# Patient Record
Sex: Female | Born: 1935 | Race: White | Hispanic: No | Smoking: Former smoker
Health system: Southern US, Community
[De-identification: ages and names within clinical notes are randomized; demographics above are authoritative.]

## PROBLEM LIST (undated history)

## (undated) ENCOUNTER — Encounter: Attending: Gynecologic Oncology | Primary: Gynecologic Oncology

## (undated) ENCOUNTER — Encounter

## (undated) ENCOUNTER — Telehealth

## (undated) ENCOUNTER — Inpatient Hospital Stay

## (undated) ENCOUNTER — Telehealth: Attending: Family | Primary: Family

## (undated) ENCOUNTER — Telehealth: Attending: Vascular Surgery | Primary: Vascular Surgery

## (undated) ENCOUNTER — Ambulatory Visit: Payer: MEDICARE

## (undated) ENCOUNTER — Ambulatory Visit

## (undated) ENCOUNTER — Ambulatory Visit: Payer: MEDICARE | Attending: Gynecologic Oncology | Primary: Gynecologic Oncology

## (undated) ENCOUNTER — Encounter: Attending: Vascular Surgery | Primary: Vascular Surgery

## (undated) ENCOUNTER — Telehealth: Attending: Gynecologic Oncology | Primary: Gynecologic Oncology

## (undated) ENCOUNTER — Encounter: Attending: Family | Primary: Family

## (undated) ENCOUNTER — Telehealth: Attending: Obstetrics & Gynecology | Primary: Obstetrics & Gynecology

## (undated) ENCOUNTER — Telehealth
Attending: Student in an Organized Health Care Education/Training Program | Primary: Student in an Organized Health Care Education/Training Program

## (undated) DIAGNOSIS — E119 Type 2 diabetes mellitus without complications: Secondary | ICD-10-CM

## (undated) DIAGNOSIS — Z9289 Personal history of other medical treatment: Secondary | ICD-10-CM

## (undated) DIAGNOSIS — I10 Essential (primary) hypertension: Secondary | ICD-10-CM

## (undated) DIAGNOSIS — E785 Hyperlipidemia, unspecified: Secondary | ICD-10-CM

## (undated) DIAGNOSIS — I509 Heart failure, unspecified: Secondary | ICD-10-CM

## (undated) DIAGNOSIS — H269 Unspecified cataract: Secondary | ICD-10-CM

## (undated) DIAGNOSIS — E079 Disorder of thyroid, unspecified: Secondary | ICD-10-CM

## (undated) DIAGNOSIS — I2510 Atherosclerotic heart disease of native coronary artery without angina pectoris: Secondary | ICD-10-CM

## (undated) DIAGNOSIS — I712 Thoracic aortic aneurysm, without rupture: Secondary | ICD-10-CM

## (undated) HISTORY — DX: Personal history of other medical treatment: Z92.89

## (undated) HISTORY — PX: ABDOMINAL AORTA STENT: SHX1108

## (undated) HISTORY — PX: SHOULDER ARTHROSCOPY W/ ROTATOR CUFF REPAIR: SHX2400

## (undated) HISTORY — DX: Type 2 diabetes mellitus without complications: E11.9

## (undated) HISTORY — PX: BACK SURGERY: SHX140

## (undated) HISTORY — DX: Atherosclerotic heart disease of native coronary artery without angina pectoris: I25.10

## (undated) HISTORY — DX: Disorder of thyroid, unspecified: E07.9

## (undated) HISTORY — PX: EYE SURGERY: SHX253

## (undated) HISTORY — PX: ABDOMINAL HYSTERECTOMY: SHX81

## (undated) HISTORY — DX: Essential (primary) hypertension: I10

## (undated) HISTORY — DX: Hyperlipidemia, unspecified: E78.5

## (undated) HISTORY — PX: FRACTURE SURGERY: SHX138

## (undated) HISTORY — DX: Heart failure, unspecified: I50.9

## (undated) HISTORY — PX: CHOLECYSTECTOMY: SHX55

## (undated) HISTORY — PX: SPINE SURGERY: SHX786

## (undated) HISTORY — DX: Unspecified cataract: H26.9

## (undated) HISTORY — PX: APPENDECTOMY: SHX54

## (undated) HISTORY — DX: Thoracic aortic aneurysm, without rupture: I71.2

---
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
## 1994-11-10 HISTORY — PX: US ECHOCARDIOGRAPHY: HXRAD669

## 1995-04-11 HISTORY — PX: CARDIAC CATHETERIZATION: SHX172

## 2002-01-13 ENCOUNTER — Encounter: Payer: Self-pay | Admitting: Cardiology

## 2002-01-13 ENCOUNTER — Encounter: Admission: RE | Admit: 2002-01-13 | Discharge: 2002-01-13 | Payer: Self-pay | Admitting: Cardiology

## 2003-01-10 ENCOUNTER — Encounter: Admission: RE | Admit: 2003-01-10 | Discharge: 2003-01-10 | Payer: Self-pay | Admitting: Cardiology

## 2003-01-10 ENCOUNTER — Encounter: Payer: Self-pay | Admitting: Cardiology

## 2003-03-22 ENCOUNTER — Observation Stay (HOSPITAL_COMMUNITY): Admission: EM | Admit: 2003-03-22 | Discharge: 2003-03-23 | Payer: Self-pay | Admitting: Cardiovascular Disease

## 2003-03-23 ENCOUNTER — Encounter: Payer: Self-pay | Admitting: *Deleted

## 2004-04-12 ENCOUNTER — Encounter: Admission: RE | Admit: 2004-04-12 | Discharge: 2004-04-12 | Payer: Self-pay | Admitting: Cardiology

## 2004-04-17 ENCOUNTER — Encounter: Admission: RE | Admit: 2004-04-17 | Discharge: 2004-04-17 | Payer: Self-pay | Admitting: Cardiology

## 2005-02-26 ENCOUNTER — Encounter: Admission: RE | Admit: 2005-02-26 | Discharge: 2005-02-26 | Payer: Self-pay | Admitting: Cardiology

## 2006-01-12 ENCOUNTER — Encounter: Admission: RE | Admit: 2006-01-12 | Discharge: 2006-01-12 | Payer: Self-pay | Admitting: Cardiology

## 2007-01-07 ENCOUNTER — Encounter: Admission: RE | Admit: 2007-01-07 | Discharge: 2007-01-07 | Payer: Self-pay | Admitting: Cardiology

## 2007-12-27 ENCOUNTER — Encounter: Admission: RE | Admit: 2007-12-27 | Discharge: 2007-12-27 | Payer: Self-pay | Admitting: Cardiology

## 2008-03-15 DIAGNOSIS — Z9289 Personal history of other medical treatment: Secondary | ICD-10-CM

## 2008-03-15 HISTORY — DX: Personal history of other medical treatment: Z92.89

## 2009-01-02 ENCOUNTER — Encounter: Admission: RE | Admit: 2009-01-02 | Discharge: 2009-01-02 | Payer: Self-pay | Admitting: Cardiology

## 2010-02-14 ENCOUNTER — Encounter: Admission: RE | Admit: 2010-02-14 | Discharge: 2010-02-14 | Payer: Self-pay | Admitting: Cardiology

## 2011-01-28 ENCOUNTER — Other Ambulatory Visit: Payer: Self-pay | Admitting: Cardiology

## 2011-01-28 DIAGNOSIS — I712 Thoracic aortic aneurysm, without rupture: Secondary | ICD-10-CM

## 2011-02-18 ENCOUNTER — Ambulatory Visit
Admission: RE | Admit: 2011-02-18 | Discharge: 2011-02-18 | Disposition: A | Discharge status: Home / Self Care | Payer: Medicare Other | Source: Ambulatory Visit | Attending: Cardiology | Admitting: Cardiology

## 2011-02-18 DIAGNOSIS — I712 Thoracic aortic aneurysm, without rupture: Secondary | ICD-10-CM

## 2011-02-18 MED ORDER — IOHEXOL 350 MG/ML SOLN
100.0000 mL | Freq: Once | INTRAVENOUS | Status: AC | PRN
  Administered 2011-02-18: 100 mL via INTRAVENOUS

## 2011-03-28 NOTE — Discharge Summary (Signed)
   NAME:  Alicia Carroll, Alicia Carroll                          ACCOUNT NO.:  [id number]   MEDICAL RECORD NO.:  [id number]                   PATIENT TYPE:  INP   LOCATION:  2030                                 FACILITY:  [facility]   PHYSICIAN:  Nanetta Batty, M.D.                DATE OF BIRTH:  04-May-1929   DATE OF ADMISSION:  03/22/2003  DATE OF DISCHARGE:  03/23/2003                                 DISCHARGE SUMMARY   DISCHARGE DIAGNOSES:  1. Chest pain, myocardial infarction ruled out.  2. Peripheral vascular disease with known abdominal aortic aneurysm, 4.6 cm     by CT this admission.  3. Cholelithiasis.  4. Treated hypertension.   HOSPITAL COURSE:  The patient is a 77 year old female followed by Dr. Clarene Duke  and Dr. Merton Border in [place].  She was admitted on 03/22/03 with chest pain.  MI was ruled out by enzymes.  Followup CT was done and the preliminary  showed a 4.6 cm ascending aortic aneurysm without pulmonary embolism.  She  did have a fatty liver with question small hypodense lesions.  We felt she  could be discharged and will follow up with Dr. Clarene Duke.   DISCHARGE MEDICATIONS:  1. Enalapril 20 mg daily.  2. Lipitor 10 mg daily.  3. Corgard 20 mg daily.  4. Lanoxin 0.25 mg daily.  5. Aspirin 325 mg daily.  6. Lasix 40 mg b.i.d.  7. Potassium 20 mEq b.i.d.  8. Norvasc 10 mg daily.  9. Imdur 30 mg daily.  10.      Protonix 40 mg daily.  11.      Nitroglycerin sublingual p.r.n.   LABORATORY DATA:  WBC 9.9, hemoglobin 12.6, hematocrit 37.2, platelets 217.  Sodium 140, potassium 3.7, BUN 12, creatinine 0.6, INR 1.0, CK-MB and  troponin's are negative x3.  Digoxin level is 0.4.  TSH 2.029.  Lipid  profile is pending.  CT preliminary is as noted above.    DISPOSITION:  The patient is discharged in stable condition.  She will have  a Cardiolite Monday at 11:15.  We will contact Dr. Fredirick Maudlin office about  further followup.     Abelino Derrick, P.A.                      Nanetta Batty,  M.D.    Lenard Lance  D:  03/23/2003  T:  03/24/2003  Job:  [id number]   cc:   Nanetta Batty, M.D.  [address]  [place]  [place] [address]  Fax: [phone]   Merton Border, M.D.  [place]  [place]

## 2012-02-04 ENCOUNTER — Other Ambulatory Visit: Payer: Self-pay | Admitting: Cardiology

## 2012-02-04 DIAGNOSIS — IMO0001 Reserved for inherently not codable concepts without codable children: Secondary | ICD-10-CM

## 2012-02-05 ENCOUNTER — Other Ambulatory Visit: Payer: Self-pay | Admitting: Cardiology

## 2012-02-09 ENCOUNTER — Ambulatory Visit
Admission: RE | Admit: 2012-02-09 | Discharge: 2012-02-09 | Disposition: A | Discharge status: Home / Self Care | Payer: Medicare Other | Source: Ambulatory Visit | Attending: Cardiology | Admitting: Cardiology

## 2012-02-09 DIAGNOSIS — IMO0001 Reserved for inherently not codable concepts without codable children: Secondary | ICD-10-CM

## 2012-02-09 MED ORDER — IOHEXOL 350 MG/ML SOLN
100.0000 mL | Freq: Once | INTRAVENOUS | Status: AC | PRN
  Administered 2012-02-09: 100 mL via INTRAVENOUS

## 2013-02-22 ENCOUNTER — Other Ambulatory Visit: Payer: Self-pay | Admitting: Internal Medicine

## 2013-02-22 DIAGNOSIS — I714 Abdominal aortic aneurysm, without rupture, unspecified: Secondary | ICD-10-CM

## 2013-03-02 ENCOUNTER — Ambulatory Visit (HOSPITAL_COMMUNITY)
Admission: RE | Admit: 2013-03-02 | Discharge: 2013-03-02 | Disposition: A | Discharge status: Home / Self Care | Payer: Medicare Other | Source: Ambulatory Visit | Attending: Internal Medicine | Admitting: Internal Medicine

## 2013-03-02 ENCOUNTER — Other Ambulatory Visit: Payer: Self-pay | Admitting: Internal Medicine

## 2013-03-02 ENCOUNTER — Ambulatory Visit (HOSPITAL_COMMUNITY): Admission: RE | Admit: 2013-03-02 | Payer: Medicare Other | Source: Ambulatory Visit

## 2013-03-02 DIAGNOSIS — I714 Abdominal aortic aneurysm, without rupture: Secondary | ICD-10-CM

## 2013-03-02 DIAGNOSIS — K5730 Diverticulosis of large intestine without perforation or abscess without bleeding: Secondary | ICD-10-CM | POA: Insufficient documentation

## 2013-03-02 DIAGNOSIS — I712 Thoracic aortic aneurysm, without rupture, unspecified: Secondary | ICD-10-CM | POA: Insufficient documentation

## 2013-03-02 DIAGNOSIS — R599 Enlarged lymph nodes, unspecified: Secondary | ICD-10-CM | POA: Insufficient documentation

## 2013-03-02 DIAGNOSIS — Z9089 Acquired absence of other organs: Secondary | ICD-10-CM | POA: Insufficient documentation

## 2013-03-02 DIAGNOSIS — I2510 Atherosclerotic heart disease of native coronary artery without angina pectoris: Secondary | ICD-10-CM | POA: Insufficient documentation

## 2013-03-02 DIAGNOSIS — I77811 Abdominal aortic ectasia: Secondary | ICD-10-CM | POA: Insufficient documentation

## 2013-03-02 MED ORDER — IOHEXOL 350 MG/ML SOLN
100.0000 mL | Freq: Once | INTRAVENOUS | Status: AC | PRN
  Administered 2013-03-02: 100 mL via INTRAVENOUS

## 2013-03-21 ENCOUNTER — Encounter: Payer: Self-pay | Admitting: *Deleted

## 2013-03-21 DIAGNOSIS — I10 Essential (primary) hypertension: Secondary | ICD-10-CM | POA: Insufficient documentation

## 2013-03-21 DIAGNOSIS — E785 Hyperlipidemia, unspecified: Secondary | ICD-10-CM | POA: Insufficient documentation

## 2013-03-21 DIAGNOSIS — E119 Type 2 diabetes mellitus without complications: Secondary | ICD-10-CM

## 2013-03-21 DIAGNOSIS — I712 Thoracic aortic aneurysm, without rupture, unspecified: Secondary | ICD-10-CM

## 2013-03-21 HISTORY — DX: Type 2 diabetes mellitus without complications: E11.9

## 2013-03-21 HISTORY — DX: Thoracic aortic aneurysm, without rupture, unspecified: I71.20

## 2013-03-21 HISTORY — DX: Thoracic aortic aneurysm, without rupture: I71.2

## 2013-03-22 ENCOUNTER — Encounter: Payer: Self-pay | Admitting: Thoracic Surgery (Cardiothoracic Vascular Surgery)

## 2013-03-22 ENCOUNTER — Institutional Professional Consult (permissible substitution) (INDEPENDENT_AMBULATORY_CARE_PROVIDER_SITE_OTHER): Payer: Medicare Other | Admitting: Thoracic Surgery (Cardiothoracic Vascular Surgery)

## 2013-03-22 VITALS — BP 117/68 | HR 72 | Resp 20 | Ht 66.0 in | Wt 143.0 lb

## 2013-03-22 DIAGNOSIS — I712 Thoracic aortic aneurysm, without rupture: Secondary | ICD-10-CM | POA: Insufficient documentation

## 2013-03-22 DIAGNOSIS — I716 Thoracoabdominal aortic aneurysm, without rupture: Secondary | ICD-10-CM

## 2013-03-22 DIAGNOSIS — Z8679 Personal history of other diseases of the circulatory system: Secondary | ICD-10-CM | POA: Insufficient documentation

## 2013-03-22 NOTE — Progress Notes (Signed)
PCP is Nino Glow, MD Referring Provider is Chrystie Nose., MD  Chief Complaint  Patient presents with  . Thoracic Aortic Aneurysm    Surgical eval, CTA Chest 03/02/13     HPI: 79 year old woman sent for evaluation of the ascending and descending thoracic aortic aneurysms.  Mrs. Jenning is an 80 year old woman who has a long history of known thoracoabdominal aneurysm. She's been followed for that for about 17 years. She was followed by Dr. Julieanne Manson for a long time. He retired in her care was shifted to Dr. Rennis Golden.  She has a past medical history significant for congestive heart failure 18 years ago, hypertension, hypercholesterolemia, hypothyroidism, and an aortic aneurysm. She has been in good health recently and denies any recent chest pain or pressure, abdominal or back pain, shortness of breath, syncopal or presyncopal spells. Denies any history of coronary disease.  She says that about a year ago she had a episode of abdominal discomfort for which she went to the hospital in [facility]. A CT of the abdomen was done and she was transferred to [facility] for emergency surgery. When she arrived there the vascular surgeons told her that she did not need surgery. She had an annual followup scan in late April.  She remains active and rides a stationary bike the equivalent of 10 miles a day.   Past Medical History  Diagnosis Date  . Hypertension   . Hyperlipidemia   . Thoracic aortic aneurysm without mention of rupture 03/21/13    ASCENDING AND DESCENDING, ECTASIA OF AORTIC ARCH  . Diabetes mellitus without complication 03/21/13    "BORDERLINE"  . Cataracts, bilateral   . Congestive heart failure   . Thyroid disease     Past Surgical History  Procedure Laterality Date  . Abdominal hysterectomy    . Cholecystectomy    . Appendectomy    . Cardiac catheterization    . Eye surgery    . Fracture surgery    . Back surgery      Family History  Problem Relation Age of  Onset  . Cancer Father   . Stroke Mother   . COPD Brother     Social History History  Substance Use Topics  . Smoking status: Former Smoker    Types: Cigarettes    Quit date: 03/22/1983  . Smokeless tobacco: Not on file  . Alcohol Use: Not on file    Current Outpatient Prescriptions  Medication Sig Dispense Refill  . amLODipine (NORVASC) 10 MG tablet Take 10 mg by mouth daily.      Marland Kitchen aspirin 81 MG tablet Take 81 mg by mouth daily.      . brimonidine (ALPHAGAN P) 0.1 % SOLN Place 2 drops into both eyes 2 (two) times daily.      . Calcium Carb-Cholecalciferol (CALCIUM + D3) 600-200 MG-UNIT TABS Take 1 tablet by mouth daily.      Marland Kitchen dexlansoprazole (DEXILANT) 60 MG capsule Take 60 mg by mouth daily.      . digoxin (LANOXIN) 0.25 MG tablet Take 0.25 mg by mouth daily.      . dorzolamide-timolol (COSOPT) 22.3-6.8 MG/ML ophthalmic solution Place 1 drop into both eyes 2 (two) times daily.      . fish oil-omega-3 fatty acids 1000 MG capsule Take 2 g by mouth daily.      . furosemide (LASIX) 40 MG tablet Take 40 mg by mouth daily.      Marland Kitchen levothyroxine (SYNTHROID, LEVOTHROID) 25 MCG tablet Take 25  mcg by mouth daily before breakfast.      . lisinopril (PRINIVIL,ZESTRIL) 40 MG tablet Take 40 mg by mouth daily.      . Multiple Vitamin (MULTIVITAMIN) tablet Take 1 tablet by mouth daily.      . nadolol (CORGARD) 20 MG tablet Take 20 mg by mouth daily.      Marland Kitchen perphenazine-amitriptyline (ETRAFON/TRIAVIL) 4-25 MG TABS Take 1 tablet by mouth daily.      . Potassium Chloride (KCL-20 PO) Take 1 tablet by mouth daily.      . simvastatin (ZOCOR) 40 MG tablet Take 40 mg by mouth every evening.      . Travoprost, BAK Free, (TRAVATAN) 0.004 % SOLN ophthalmic solution Place 2 drops into both eyes at bedtime.       No current facility-administered medications for this visit.    Allergies  Allergen Reactions  . Codeine Nausea And Vomiting    Review of Systems  HENT:       Wears glasses   Cardiovascular: Positive for leg swelling. Negative for chest pain.  Gastrointestinal: Negative for abdominal pain.       Hiatal hernia  Genitourinary: Negative for flank pain.  Musculoskeletal: Negative for back pain.  Neurological: Negative for dizziness and syncope.  Hematological: Bruises/bleeds easily.  All other systems reviewed and are negative.    BP 117/68  Pulse 72  Resp 20  Ht 5\' 6"  (1.676 m)  Wt 143 lb (64.864 kg)  BMI 23.09 kg/m2  SpO2 96% Physical Exam  Vitals reviewed. Constitutional: She is oriented to person, place, and time. She appears well-developed and well-nourished. No distress.  HENT:  Head: Normocephalic and atraumatic.  Eyes: EOM are normal. Pupils are equal, round, and reactive to light.  Neck: Neck supple. No thyromegaly present.  Cardiovascular: Normal rate and regular rhythm.   Murmur (2-3/6 harsh systolic murmur) heard. Pulmonary/Chest: Effort normal and breath sounds normal. She has no wheezes. She has no rales.  Abdominal: Soft. She exhibits mass (Pulsatile mass palpable in the upper abdomen, nontender). There is no tenderness.  Musculoskeletal: She exhibits no edema.  Lymphadenopathy:    She has no cervical adenopathy.  Neurological: She is alert and oriented to person, place, and time. No cranial nerve deficit.  Skin: Skin is warm and dry.     Diagnostic Tests: CT angiogram of chest 03/02/13 *RADIOLOGY REPORT*  Clinical Data: Follow-up evaluation for known thoracic aortic  aneurysm.  CT ANGIOGRAPHY CHEST, ABDOMEN AND PELVIS, 3-DIMENSIONAL CT IMAGE  RENDERING ON INDEPENDENT WORKSTATION  Technique: Multidetector CT imaging through the chest, abdomen and  pelvis was performed using the standard protocol during bolus  administration of intravenous contrast. Multiplanar reconstructed  images including MIPs were obtained and reviewed to evaluate the  vascular anatomy. Post processing was performed on an independent  workstation and to  create three-dimensional reconstructions.  Contrast: OMNIPAQUE IOHEXOL 350 MG/ML SOLN  Comparison: CTA of the chest abdomen and pelvis 02/09/2012.  CTA CHEST  Findings:  Mediastinum: Mild aneurysmal dilatation of the ascending thoracic  aorta (4.6 x 4.8 cm) is noted. The thoracic aortic arch is ectatic  measuring up to 3.5 cm in diameter. The descending thoracic aorta  is remarkable for fusiform aneurysmal dilatation which is most  pronounced shortly above the aortic hiatus, where the aorta  measures up to 5.2 x 5.6 cm (outer diameter) when measured  orthogonal to the direction of blood flow on multiplanar  reconstructions. This appears only slightly increased compared to  the prior  study from 02/09/2012, however, along the anteromedial  wall of the aorta as it transitions through the aortic hiatus there  is a new small filling defect which appears to represent some  developing mural thrombus. No evidence of thoracic aortic  dissection at this time. Heart size is mildly enlarged. There is no  significant pericardial fluid, thickening or pericardial  calcification. There is atherosclerosis of the thoracic aorta, the  great vessels of the mediastinum and the coronary arteries,  including calcified atherosclerotic plaque in the left anterior  descending and right coronary arteries. Mild calcification of the  aortic valve. Thickening of the esophagus, most severe in the  distal third, concerning for potential esophagitis. No  pathologically enlarged mediastinal or hilar lymph nodes.  Heterogeneous appearing thyroid gland, similar to prior studies.  Lungs/Pleura: No acute consolidative airspace disease. No pleural  effusions. No definite suspicious appearing pulmonary nodules or  masses are identified.  Musculoskeletal: There are no aggressive appearing lytic or blastic  lesions noted in the visualized portions of the skeleton.  Review of the MIP images confirms the above findings.   IMPRESSION:  1. Fusiform aneurysmal dilatation of the descending thoracic aorta  measuring up to 5.2 x 5.6 cm shortly above the level of the aortic  hiatus. This is very similar to the prior study from 02/09/2012.  The notable difference, is a small filling defect along the  anteromedial wall of the descending thoracic aorta which appears to  represent some developing mural thrombus. No thoracic aortic  dissection at this time.  2. No significant change in mild aneurysmal dilatation of the  ascending thoracic aorta (4.6 x 4.8 cm), or mild ectasia of the  aortic arch (3.5 cm in diameter) compared to the prior examination.  3. Extensive atherosclerosis, including two-vessel coronary artery  disease.  4. Thickening of the esophagus, most severe in the distal third.  This is similar to the prior examination, and may be a  manifestation of esophagitis.  5. Additional incidental findings, as above.   Impression: 79 year old woman with 2 separate aortic aneurysms. She has a 4.6 cm ascending aortic aneurysm. Of more immediate concern she also has a larger thoracoabdominal aneurysm. It was measured at 5.2 x 5.6 cm by radiology. I can really only find an area where it is in the 5.2 range. This is not significantly changed from her scan last year. In going back as far as 2011 there is not a dramatic change in the size of the aneurysm although I do think it has grown slightly in that time frame.   In my opinion this thoracoabdominal aneurysm is large enough that consideration should be given to elective surgical repair. In reviewing the films I think that there is a chance this could be done with a stent graft, but does approach the visceral vessels inferiorly. I would like to have Dr. Durene Cal of [facility] see her and look at the films and see if he thinks it should be a candidate for a stent graft repair.  The ascending aortic aneurysm also may have grown very slightly over the 3 year interval from  2011 to now, but is not large enough at this point to warrant surgical intervention. It will need to be followed up with a CT angiogram one year.  Plan: Consult Dr. Myra Gianotti for possible stent graft with thoracoabdominal aneurysm  Followup in one year with CT angiogram chest

## 2013-05-06 ENCOUNTER — Encounter: Payer: Self-pay | Admitting: Surgery

## 2013-05-09 ENCOUNTER — Encounter: Payer: Self-pay | Admitting: Surgery

## 2013-05-09 ENCOUNTER — Ambulatory Visit (INDEPENDENT_AMBULATORY_CARE_PROVIDER_SITE_OTHER): Payer: Medicare Other | Admitting: Surgery

## 2013-05-09 VITALS — BP 137/74 | HR 66 | Resp 16 | Ht 63.0 in | Wt 144.0 lb

## 2013-05-09 DIAGNOSIS — I712 Thoracic aortic aneurysm, without rupture, unspecified: Secondary | ICD-10-CM | POA: Insufficient documentation

## 2013-05-09 NOTE — Progress Notes (Signed)
[facility]   Patient name: Alicia Carroll MRN: [id number] DOB: [birth year] Sex: female   Referred by: Dr. Dorris Fetch  Reason for referral:  Chief Complaint  Patient presents with  . New Evaluation    5 cm TAA for possible stent graft, Ref. by Dr. Dorris Fetch    HISTORY OF PRESENT ILLNESS: The patient comes in today for evaluation of a known thoracoabdominal aneurysm. This has been followed for approximately 17 years. She recently had a CT scan which showed that it was greater than 5 cm. She denies any abdominal or back pain.  The patient has a history of heart failure 18 years ago. She is medically managed for hypertension. She is on a statin for hypercholesterolemia. She has been diagnosed with borderline diabetes, although her blood sugars every morning that she checks are around 95-100. She does not have a history of coronary artery disease. She is very active and ride the stationary bike approximately 10 miles per day. She has a history of smoking but quit in 1984.  Past Medical History  Diagnosis Date  . Hypertension   . Hyperlipidemia   . Thoracic aortic aneurysm without mention of rupture 03/21/13    ASCENDING AND DESCENDING, ECTASIA OF AORTIC ARCH  . Diabetes mellitus without complication 03/21/13    "BORDERLINE"  . Cataracts, bilateral   . Congestive heart failure   . Thyroid disease   . Coronary artery disease     Past Surgical History  Procedure Laterality Date  . Abdominal hysterectomy    . Cholecystectomy    . Appendectomy    . Cardiac catheterization    . Fracture surgery    . Back surgery    . Eye surgery      Bilateral cataract  . Spine surgery    . Shoulder arthroscopy w/ rotator cuff repair Right     History   Social History  . Marital Status: Widowed    Spouse Name: N/A    Number of Children: 4  . Years of Education: N/A   Occupational History  . retired    Social History Main Topics  . Smoking status: Former  Smoker    Types: Cigarettes    Quit date: 03/22/1983  . Smokeless tobacco: Never Used  . Alcohol Use: No  . Drug Use: No  . Sexually Active: Not on file   Other Topics Concern  . Not on file   Social History Narrative  . No narrative on file    Family History  Problem Relation Age of Onset  . Cancer Father   . Stroke Mother   . COPD Brother   . Cancer Sister   . Heart disease Sister   . Hypertension Sister   . COPD Brother     Allergies as of 05/09/2013 - Review Complete 05/09/2013  Allergen Reaction Noted  . Codeine Nausea And Vomiting 03/21/2013    Current Outpatient Prescriptions on File Prior to Visit  Medication Sig Dispense Refill  . amLODipine (NORVASC) 10 MG tablet Take 10 mg by mouth daily.      Marland Kitchen aspirin 81 MG tablet Take 81 mg by mouth daily.      . brimonidine (ALPHAGAN P) 0.1 % SOLN Place 2 drops into both eyes 2 (two) times daily.      . Calcium Carb-Cholecalciferol (CALCIUM + D3) 600-200 MG-UNIT TABS Take 1 tablet by mouth daily.      Marland Kitchen dexlansoprazole (DEXILANT) 60 MG capsule Take 60 mg by mouth  daily.      . digoxin (LANOXIN) 0.25 MG tablet Take 0.25 mg by mouth daily.      . dorzolamide-timolol (COSOPT) 22.3-6.8 MG/ML ophthalmic solution Place 1 drop into both eyes 2 (two) times daily.      . fish oil-omega-3 fatty acids 1000 MG capsule Take 2 g by mouth daily.      . furosemide (LASIX) 40 MG tablet Take 40 mg by mouth daily.      Marland Kitchen levothyroxine (SYNTHROID, LEVOTHROID) 25 MCG tablet Take 25 mcg by mouth daily before breakfast.      . lisinopril (PRINIVIL,ZESTRIL) 40 MG tablet Take 40 mg by mouth daily.      . Multiple Vitamin (MULTIVITAMIN) tablet Take 1 tablet by mouth daily. Centrum Silver      . nadolol (CORGARD) 20 MG tablet Take 20 mg by mouth daily.      Marland Kitchen perphenazine-amitriptyline (ETRAFON/TRIAVIL) 4-25 MG TABS Take 1 tablet by mouth daily.      . Potassium Chloride (KCL-20 PO) Take 1 tablet by mouth daily.      . simvastatin (ZOCOR) 40 MG  tablet Take 40 mg by mouth every evening.      . Travoprost, BAK Free, (TRAVATAN) 0.004 % SOLN ophthalmic solution Place 2 drops into both eyes at bedtime.       No current facility-administered medications on file prior to visit.     REVIEW OF SYSTEMS: Cardiovascular: No chest pain, chest pressure, palpitations, orthopnea, or dyspnea on exertion. No claudication or rest pain,  No history of DVT or phlebitis. Pulmonary: No productive cough, asthma or wheezing. Neurologic: No weakness, paresthesias, aphasia, or amaurosis. No dizziness. Hematologic: No bleeding problems or clotting disorders. Musculoskeletal: No joint pain or joint swelling. Gastrointestinal: No blood in stool or hematemesis Genitourinary: No dysuria or hematuria. Psychiatric:: No history of major depression. Integumentary: No rashes or ulcers. Constitutional: No fever or chills.  PHYSICAL EXAMINATION: General: The patient appears their stated age.  Vital signs are BP 137/74  Pulse 66  Resp 16  Ht 5\' 3"  (1.6 m)  Wt 144 lb (65.318 kg)  BMI 25.51 kg/m2  SpO2 96% HEENT:  No gross abnormalities Pulmonary: Respirations are non-labored Abdomen: Soft and non-tender  Musculoskeletal: There are no major deformities.   Neurologic: No focal weakness or paresthesias are detected, Skin: There are no ulcer or rashes noted. Psychiatric: The patient has normal affect. Cardiovascular: There is a regular rate and rhythm without significant murmur appreciated. No carotid bruits. 2+ pitting lower extremity edema.  Diagnostic Studies: I have reviewed her CT scan. This shows a distal descending thoracic aortic aneurysm which extends down and includes her mesenteric vessels. By my measurement the maximum diameter is 5.2 cm   Assessment:  Thoracoabdominal aneurysm Plan: I have reviewed the images with the patient. We have diagrammed out her aneurysm and the location of the visceral vessels. I offered her the option of open surgical  repair and [facility] but suggested that she go to [facility] to be evaluated for potential fenestrated / branch graft repair which is not available and [facility]. I will make a copy of her CT angiogram and send her to Dr. Pattricia Boss.     Jorge Ny, M.D. [facility] [place] Office: [phone] Pager:  [phone]

## 2013-08-08 ENCOUNTER — Encounter: Payer: Self-pay | Admitting: *Deleted

## 2013-08-15 ENCOUNTER — Ambulatory Visit (INDEPENDENT_AMBULATORY_CARE_PROVIDER_SITE_OTHER): Payer: Medicare Other | Admitting: Internal Medicine

## 2013-08-15 ENCOUNTER — Encounter: Payer: Self-pay | Admitting: Internal Medicine

## 2013-08-15 VITALS — BP 132/76 | HR 65 | Ht 63.0 in | Wt 141.3 lb

## 2013-08-15 DIAGNOSIS — I447 Left bundle-branch block, unspecified: Secondary | ICD-10-CM | POA: Insufficient documentation

## 2013-08-15 DIAGNOSIS — E785 Hyperlipidemia, unspecified: Secondary | ICD-10-CM

## 2013-08-15 DIAGNOSIS — I716 Thoracoabdominal aortic aneurysm, without rupture: Secondary | ICD-10-CM

## 2013-08-15 DIAGNOSIS — I10 Essential (primary) hypertension: Secondary | ICD-10-CM

## 2013-08-15 DIAGNOSIS — I712 Thoracic aortic aneurysm, without rupture: Secondary | ICD-10-CM

## 2013-08-15 NOTE — Patient Instructions (Addendum)
Your physician recommends that you schedule a follow-up appointment in: 6 MONTHS. No changes were made today in your therapy. 

## 2013-08-15 NOTE — Progress Notes (Signed)
OFFICE NOTE  Chief Complaint:  Routine follow-up  Primary Care Physician: Nino Glow, MD  HPI:  Alicia Carroll is a pleasant 79 year old female is following up today on her asymptomatic ascending and descending aortic aneurysms. The descending aortic aneurysm measured about 4.6 cm in April 2013 and the descending aortic aneurysm measures 6.5 x 5.1 cm at the aortic hiatus. She was referred to Dr. Robby Sermon in for evaluation of her aneurysms, and he felt that her thoracic aneurysm was stable and required only monitoring while her abdominal aneurysm could possibly be fixed with a stent graft. She was then referred to Dr. Myra Gianotti who thought that she may benefit from a fenestrated graft and he referred her to [facility]. She was apparently evaluated there and underwent another series of scans and was offered surgery versus waiting for 6 months. She elected to wait for 6 months and undergo another set of scans to see if there is any significant change. Chest is a history of left bundle-branch block which has been stable and hypertension which is well controlled.    PMHx:  Past Medical History  Diagnosis Date  . Hypertension   . Hyperlipidemia   . Thoracic aortic aneurysm without mention of rupture 03/21/13    ASCENDING AND DESCENDING, ECTASIA OF AORTIC ARCH  . Diabetes mellitus without complication 03/21/13    "BORDERLINE"  . Cataracts, bilateral   . Congestive heart failure   . Thyroid disease   . Coronary artery disease   . History of nuclear stress test 03/15/2008    dipyridamole; fixed septal defect r/t LBBB; no ischemia, low risk    Past Surgical History  Procedure Laterality Date  . Abdominal hysterectomy    . Cholecystectomy    . Appendectomy    . Cardiac catheterization  04/1995    no evidence of CAD, normal aortic root with no aortic insuff., no MR (Dr. Julieanne Manson)  . Fracture surgery    . Back surgery    . Eye surgery      Bilateral cataract  . Spine surgery     . Shoulder arthroscopy w/ rotator cuff repair Right   . US echocardiography  1996    normal LV size & function, mild aortic insuff.    FAMHx:  Family History  Problem Relation Age of Onset  . Kidney cancer Father     also MI  . Stroke Mother     also HTN  . COPD Brother     x2  . Lung cancer Sister   . Heart disease Sister   . Hypertension Sister   . Heart failure Sister   . Rheum arthritis Sister   . Thyroid disease Child     x2  . Heart Problems Child     arrhythmia    SOCHx:   reports that she quit smoking about 30 years ago. Her smoking use included Cigarettes. She smoked 0.00 packs per day. She has never used smokeless tobacco. She reports that she does not drink alcohol or use illicit drugs.  ALLERGIES:  Allergies  Allergen Reactions  . Codeine Nausea And Vomiting    ROS: A comprehensive review of systems was negative.  HOME MEDS: Current Outpatient Prescriptions  Medication Sig Dispense Refill  . amLODipine (NORVASC) 10 MG tablet Take 10 mg by mouth daily.      Marland Kitchen aspirin 81 MG tablet Take 81 mg by mouth daily.      . brimonidine (ALPHAGAN P) 0.1 % SOLN Place 2  drops into both eyes 2 (two) times daily.      . Calcium Carb-Cholecalciferol (CALCIUM + D3) 600-200 MG-UNIT TABS Take 1 tablet by mouth daily.      Marland Kitchen dexlansoprazole (DEXILANT) 60 MG capsule Take 60 mg by mouth 2 (two) times daily.       . digoxin (LANOXIN) 0.25 MG tablet Take 0.25 mg by mouth daily.      . dorzolamide-timolol (COSOPT) 22.3-6.8 MG/ML ophthalmic solution Place 1 drop into both eyes 2 (two) times daily.      . fish oil-omega-3 fatty acids 1000 MG capsule Take 2 g by mouth daily.      . furosemide (LASIX) 40 MG tablet Take 40 mg by mouth daily.      Marland Kitchen levothyroxine (SYNTHROID, LEVOTHROID) 25 MCG tablet Take 25 mcg by mouth daily before breakfast.      . lisinopril (PRINIVIL,ZESTRIL) 40 MG tablet Take 40 mg by mouth daily.      . Multiple Vitamin (MULTIVITAMIN) tablet Take 1 tablet by  mouth daily. Centrum Silver      . nadolol (CORGARD) 20 MG tablet Take 20 mg by mouth daily.      Marland Kitchen perphenazine-amitriptyline (ETRAFON/TRIAVIL) 2-25 MG TABS Take 1 tablet by mouth daily. Take 1/2 tablet      . Potassium Chloride (KCL-20 PO) Take 1 tablet by mouth daily.      . simvastatin (ZOCOR) 40 MG tablet Take 40 mg by mouth every evening.      . Travoprost, BAK Free, (TRAVATAN) 0.004 % SOLN ophthalmic solution Place 2 drops into both eyes at bedtime.       No current facility-administered medications for this visit.    LABS/IMAGING: No results found for this or any previous visit (from the past 48 hour(s)). No results found.  VITALS: BP 132/76  Pulse 65  Ht 5\' 3"  (1.6 m)  Wt 141 lb 4.8 oz (64.093 kg)  BMI 25.04 kg/m2  EXAM: General appearance: alert and no distress Lungs: clear to auscultation bilaterally Heart: regular rate and rhythm, S1, S2 normal, no murmur, click, rub or gallop Extremities: extremities normal, atraumatic, no cyanosis or edema Pulses: 2+ and symmetric Neurologic: Grossly normal  EKG: Normal sinus rhythm at 65 with a left bundle branch block and first degree AV block (PR interval 232 ms)  ASSESSMENT: 1. Stable thoracic and brachial abdominal aneurysms-now followed at [facility] 2. Hypertension-controlled 3. Left bundle branch block-stable  PLAN: 1.   [name] has both thoracic and throughout the abdominal aneurysms, the latter being more significant measuring at least 5.6 cm in diameter and involving both the superior mesenteric and celiac vessels.  She was referred to [facility] for evaluation of a fenestrated graft and is felt to be a good candidate for that, however she was quite interested in surgery. She is going back for repeat imaging in January and may possibly like to have surgery at that time if her scan indicates a worsening of her AAA.  Otherwise from a cardiac standpoint she appears stable. I will plan to see her back in 6 months  or sooner as necessary.  Chrystie Nose, MD, [facility] Attending Cardiologist [facility]  HILTY,Kenneth C 08/15/2013, 3:35 PM

## 2013-10-31 ENCOUNTER — Encounter: Payer: Self-pay | Admitting: Cardiology

## 2013-11-30 ENCOUNTER — Telehealth: Payer: Self-pay | Admitting: Internal Medicine

## 2013-11-30 NOTE — Telephone Encounter (Signed)
Spoke with patient's daughter and she informed me the patient was in the hospital in [place] for some chest discomfort and was kept overnight for observation. The doctors there seems to think that she is taking too much lanoxin. She doesn't want to change any medications without Dr. Rennis GoldenHilty giving the ok. I informed her that Dr. Rennis GoldenHilty will need to see the patient to access her condition now. She last saw him in October 2014. She will check with her mom about some times she can come and call back for an appointment.

## 2013-11-30 NOTE — Telephone Encounter (Signed)
Mother was in the ER last night in [place] . The E/R thinks that the digoxin and lanoxin is too much and doing something to her heart rhythm . Would like to speak to you .Marland Kitchen. Please Call    Thanks

## 2013-12-21 ENCOUNTER — Encounter: Payer: Self-pay | Admitting: Internal Medicine

## 2013-12-21 ENCOUNTER — Ambulatory Visit (INDEPENDENT_AMBULATORY_CARE_PROVIDER_SITE_OTHER): Payer: Medicare Other | Admitting: Internal Medicine

## 2013-12-21 VITALS — BP 160/70 | HR 67 | Ht 62.0 in | Wt 140.0 lb

## 2013-12-21 DIAGNOSIS — I712 Thoracic aortic aneurysm, without rupture, unspecified: Secondary | ICD-10-CM

## 2013-12-21 DIAGNOSIS — I7121 Aneurysm of the ascending aorta, without rupture: Secondary | ICD-10-CM

## 2013-12-21 DIAGNOSIS — I716 Thoracoabdominal aortic aneurysm, without rupture, unspecified: Secondary | ICD-10-CM

## 2013-12-21 DIAGNOSIS — E785 Hyperlipidemia, unspecified: Secondary | ICD-10-CM

## 2013-12-21 DIAGNOSIS — I447 Left bundle-branch block, unspecified: Secondary | ICD-10-CM

## 2013-12-21 DIAGNOSIS — I10 Essential (primary) hypertension: Secondary | ICD-10-CM

## 2013-12-21 NOTE — Progress Notes (Signed)
OFFICE NOTE  Chief Complaint:  Routine follow-up  Primary Care Physician: Nino Glow, MD  HPI:  Alicia Carroll is a pleasant 80 year old female is following up today on her asymptomatic ascending and descending aortic aneurysms. The descending aortic aneurysm measured about 4.6 cm in April 2013 and the descending aortic aneurysm measures 6.5 x 5.1 cm at the aortic hiatus. She was referred to Dr. Robby Sermon in for evaluation of her aneurysms, and he felt that her thoracic aneurysm was stable and required only monitoring while her abdominal aneurysm could possibly be fixed with a stent graft. She was then referred to Dr. Myra Gianotti who thought that she may benefit from a fenestrated graft and he referred her to [facility]. She was apparently evaluated there and underwent another series of scans and was offered surgery versus waiting for 6 months. She elected to wait for 6 months and undergo another set of scans to see if there is any significant change. She has a history of left bundle-branch block which has been stable and hypertension which is well controlled.    She recently had what sounds like an upper respiratory infection around the time that 2 family members either had pneumonia or the flu. With this she had chest congestion and some tightness in her chest and she presented to the emergency room at [facility] in [place]. Apparently they thought she was having acute MI at that time based on her left bundle branch block, and failed to contact us to discover that this was an old finding. She was heparinized and admitted for rule out coronary syndrome, which she did. Ultimately she was diagnosed with a viral upper respiratory infection. They did however recommend outpatient stress testing. Her last stress test was in 2009 and was nonischemic. She has had no further chest tightness. She was seen about 2 weeks ago back at [facility] and they're continuing to monitor her  aneurysm which is stable. This is in contrast to a CT scan that was performed at [facility], indicating that her aneurysm had increased to 6.8 cm-what it actually has been fairly stable.  PMHx:  Past Medical History  Diagnosis Date  . Hypertension   . Hyperlipidemia   . Thoracic aortic aneurysm without mention of rupture 03/21/13    ASCENDING AND DESCENDING, ECTASIA OF AORTIC ARCH  . Diabetes mellitus without complication 03/21/13    "BORDERLINE"  . Cataracts, bilateral   . Congestive heart failure   . Thyroid disease   . Coronary artery disease   . History of nuclear stress test 03/15/2008    dipyridamole; fixed septal defect r/t LBBB; no ischemia, low risk    Past Surgical History  Procedure Laterality Date  . Abdominal hysterectomy    . Cholecystectomy    . Appendectomy    . Cardiac catheterization  04/1995    no evidence of CAD, normal aortic root with no aortic insuff., no MR (Dr. Julieanne Manson)  . Fracture surgery    . Back surgery    . Eye surgery      Bilateral cataract  . Spine surgery    . Shoulder arthroscopy w/ rotator cuff repair Right   . US echocardiography  1996    normal LV size & function, mild aortic insuff.    FAMHx:  Family History  Problem Relation Age of Onset  . Kidney cancer Father     also MI  . Stroke Mother     also HTN  .  COPD Brother     x2  . Lung cancer Sister   . Heart disease Sister   . Hypertension Sister   . Heart failure Sister   . Rheum arthritis Sister   . Thyroid disease Child     x2  . Heart Problems Child     arrhythmia    SOCHx:   reports that she quit smoking about 30 years ago. Her smoking use included Cigarettes. She smoked 0.00 packs per day. She has never used smokeless tobacco. She reports that she does not drink alcohol or use illicit drugs.  ALLERGIES:  Allergies  Allergen Reactions  . Codeine Nausea And Vomiting    ROS: A comprehensive review of systems was negative.  HOME MEDS: Current  Outpatient Prescriptions  Medication Sig Dispense Refill  . amLODipine (NORVASC) 10 MG tablet Take 10 mg by mouth daily.      Marland Kitchen. aspirin 81 MG tablet Take 81 mg by mouth daily.      . brimonidine (ALPHAGAN P) 0.1 % SOLN Place 2 drops into both eyes 2 (two) times daily.      . Calcium Carb-Cholecalciferol (CALCIUM + D3) 600-200 MG-UNIT TABS Take 1 tablet by mouth daily.      Marland Kitchen. dexlansoprazole (DEXILANT) 60 MG capsule Take 60 mg by mouth 2 (two) times daily.       . dorzolamide-timolol (COSOPT) 22.3-6.8 MG/ML ophthalmic solution Place 1 drop into both eyes 2 (two) times daily.      . fish oil-omega-3 fatty acids 1000 MG capsule Take 2 g by mouth daily.      . furosemide (LASIX) 40 MG tablet Take 40 mg by mouth daily.      Marland Kitchen. levothyroxine (SYNTHROID, LEVOTHROID) 25 MCG tablet Take 25 mcg by mouth daily before breakfast.      . lisinopril (PRINIVIL,ZESTRIL) 40 MG tablet Take 40 mg by mouth daily.      . Multiple Vitamin (MULTIVITAMIN) tablet Take 1 tablet by mouth daily. Centrum Silver      . nadolol (CORGARD) 20 MG tablet Take 20 mg by mouth daily.      Marland Kitchen. perphenazine-amitriptyline (ETRAFON/TRIAVIL) 2-25 MG TABS Take 1 tablet by mouth daily. Take 1/2 tablet      . Potassium Chloride (KCL-20 PO) Take 1 tablet by mouth daily.      . simvastatin (ZOCOR) 40 MG tablet Take 40 mg by mouth every evening.      . Travoprost, BAK Free, (TRAVATAN) 0.004 % SOLN ophthalmic solution Place 2 drops into both eyes at bedtime.       No current facility-administered medications for this visit.    LABS/IMAGING: No results found for this or any previous visit (from the past 48 hour(s)). No results found.  VITALS: BP 160/70  Pulse 67  Ht 5\' 2"  (1.575 m)  Wt 140 lb (63.504 kg)  BMI 25.60 kg/m2  EXAM: General appearance: alert and no distress Lungs: clear to auscultation bilaterally Heart: regular rate and rhythm, S1, S2 normal, no murmur, click, rub or gallop Extremities: extremities normal, atraumatic, no  cyanosis or edema Pulses: 2+ and symmetric Neurologic: Grossly normal  EKG: Normal sinus rhythm at 67 with a left bundle branch block and first degree AV block (PR interval 250 ms)  ASSESSMENT: 1. Stable thoracic and abdominal aneurysms-now followed at [facility] 2. Hypertension-controlled 3. Left bundle branch block-stable  PLAN: 1.   [name] has both thoracic and throughout the abdominal aneurysms, the latter being more significant measuring now 5.8  cm in diameter and involving both the superior mesenteric and celiac vessels.  She was referred to [facility] for evaluation of a fenestrated graft and is felt to be a good candidate for that, however she was quite interested in surgery. She continues to have it monitored there. This recent episode was most likely a viral URI, and unfortunately they do not recognize she had a prior left bundle branch block. She ruled out for acute coronary syndrome and was discharged and has had no recurrences.  While her last stress test was over 5 years ago, we could certainly consider repeat stress testing although this was not an anginal episode. The only indication would be preoperative as she will need some type of risk stratification prior to vascular surgery. At this time we will defer additional stress testing until it was requested for preoperative clearance.  Alicia Nose, MD, [facility] Attending Cardiologist [facility]  Alicia Carroll C 12/21/2013, 6:26 PM

## 2013-12-21 NOTE — Patient Instructions (Signed)
Your physician has recommended you make the following change in your medication: STOP digoxin  Your physician wants you to follow-up in: 6 months. You will receive a reminder letter in the mail two months in advance. If you don't receive a letter, please call our office to schedule the follow-up appointment.

## 2014-02-09 ENCOUNTER — Other Ambulatory Visit: Payer: Self-pay

## 2014-02-09 DIAGNOSIS — I712 Thoracic aortic aneurysm, without rupture, unspecified: Secondary | ICD-10-CM

## 2014-03-23 ENCOUNTER — Emergency Department (HOSPITAL_BASED_OUTPATIENT_CLINIC_OR_DEPARTMENT_OTHER)
Admission: EM | Admit: 2014-03-23 | Discharge: 2014-03-23 | Disposition: A | Discharge status: Home / Self Care | Payer: Medicare Other | Attending: Emergency Medicine | Admitting: Emergency Medicine

## 2014-03-23 ENCOUNTER — Emergency Department (HOSPITAL_BASED_OUTPATIENT_CLINIC_OR_DEPARTMENT_OTHER): Payer: Medicare Other

## 2014-03-23 ENCOUNTER — Encounter (HOSPITAL_BASED_OUTPATIENT_CLINIC_OR_DEPARTMENT_OTHER): Payer: Self-pay | Admitting: Emergency Medicine

## 2014-03-23 ENCOUNTER — Telehealth: Payer: Self-pay | Admitting: Physician Assistant

## 2014-03-23 DIAGNOSIS — I2510 Atherosclerotic heart disease of native coronary artery without angina pectoris: Secondary | ICD-10-CM | POA: Insufficient documentation

## 2014-03-23 DIAGNOSIS — Z7982 Long term (current) use of aspirin: Secondary | ICD-10-CM | POA: Insufficient documentation

## 2014-03-23 DIAGNOSIS — Z8639 Personal history of other endocrine, nutritional and metabolic disease: Secondary | ICD-10-CM | POA: Insufficient documentation

## 2014-03-23 DIAGNOSIS — I509 Heart failure, unspecified: Secondary | ICD-10-CM | POA: Insufficient documentation

## 2014-03-23 DIAGNOSIS — Z87891 Personal history of nicotine dependence: Secondary | ICD-10-CM | POA: Insufficient documentation

## 2014-03-23 DIAGNOSIS — Z9189 Other specified personal risk factors, not elsewhere classified: Secondary | ICD-10-CM | POA: Insufficient documentation

## 2014-03-23 DIAGNOSIS — R0681 Apnea, not elsewhere classified: Secondary | ICD-10-CM | POA: Insufficient documentation

## 2014-03-23 DIAGNOSIS — E785 Hyperlipidemia, unspecified: Secondary | ICD-10-CM | POA: Insufficient documentation

## 2014-03-23 DIAGNOSIS — Z79899 Other long term (current) drug therapy: Secondary | ICD-10-CM | POA: Insufficient documentation

## 2014-03-23 DIAGNOSIS — Z9889 Other specified postprocedural states: Secondary | ICD-10-CM | POA: Insufficient documentation

## 2014-03-23 DIAGNOSIS — H269 Unspecified cataract: Secondary | ICD-10-CM | POA: Insufficient documentation

## 2014-03-23 DIAGNOSIS — Z862 Personal history of diseases of the blood and blood-forming organs and certain disorders involving the immune mechanism: Secondary | ICD-10-CM | POA: Insufficient documentation

## 2014-03-23 DIAGNOSIS — R002 Palpitations: Secondary | ICD-10-CM

## 2014-03-23 DIAGNOSIS — Z8679 Personal history of other diseases of the circulatory system: Secondary | ICD-10-CM | POA: Insufficient documentation

## 2014-03-23 DIAGNOSIS — I10 Essential (primary) hypertension: Secondary | ICD-10-CM | POA: Insufficient documentation

## 2014-03-23 LAB — COMPREHENSIVE METABOLIC PANEL
ALT: 13 U/L (ref 0–35)
AST: 26 U/L (ref 0–37)
Albumin: 4.5 g/dL (ref 3.5–5.2)
Alkaline Phosphatase: 85 U/L (ref 39–117)
BUN: 17 mg/dL (ref 6–23)
CALCIUM: 9.9 mg/dL (ref 8.4–10.5)
CO2: 28 meq/L (ref 19–32)
Chloride: 102 mEq/L (ref 96–112)
Creatinine, Ser: 0.7 mg/dL (ref 0.50–1.10)
GFR calc Af Amer: >90 mL/min (ref >90)
GFR calc non Af Amer: 79 mL/min — ABNORMAL LOW (ref >90)
Glucose, Bld: 133 mg/dL — ABNORMAL HIGH (ref 70–99)
Potassium: 4.3 mEq/L (ref 3.7–5.3)
SODIUM: 144 meq/L (ref 137–147)
TOTAL PROTEIN: 8.4 g/dL — AB (ref 6.0–8.3)
Total Bilirubin: <0.2 mg/dL — ABNORMAL LOW (ref 0.3–1.2)

## 2014-03-23 LAB — CBC
HCT: 43.8 % (ref 36.0–46.0)
Hemoglobin: 14.8 g/dL (ref 12.0–15.0)
MCH: 31.4 pg (ref 26.0–34.0)
MCHC: 33.8 g/dL (ref 30.0–36.0)
MCV: 92.8 fL (ref 78.0–100.0)
PLATELETS: 198 10*3/uL (ref 150–400)
RBC: 4.72 MIL/uL (ref 3.87–5.11)
RDW: 13.7 % (ref 11.5–15.5)
WBC: 7.7 10*3/uL (ref 4.0–10.5)

## 2014-03-23 LAB — TROPONIN I: Troponin I: <0.3 ng/mL (ref <0.30)

## 2014-03-23 NOTE — ED Notes (Signed)
Pt sts her heart "isn't beating right" today.  Denies pain.  Slightly SOB.  PMD said to come to Ed to be checked.

## 2014-03-23 NOTE — Telephone Encounter (Signed)
Patient's daughter called in to the answering service after hours to let us know the patient developed fluttering this afternoon and is on her way to the [facility] ER. She feels well otherwise without CP or SOB. Thanked them for letting me know. We will be on the lookout to get involved if called. Vangie Henthorn PA-C

## 2014-03-23 NOTE — Discharge Instructions (Signed)
Palpitations   A palpitation is the feeling that your heartbeat is irregular or is faster than normal. It may feel like your heart is fluttering or skipping a beat. Palpitations are usually not a serious problem. However, in some cases, you may need further medical evaluation.  CAUSES   Palpitations can be caused by:   Smoking.   Caffeine or other stimulants, such as diet pills or energy drinks.   Alcohol.   Stress and anxiety.   Strenuous physical activity.   Fatigue.   Certain medicines.   Heart disease, especially if you have a history of arrhythmias. This includes atrial fibrillation, atrial flutter, or supraventricular tachycardia.   An improperly working pacemaker or defibrillator.  DIAGNOSIS   To find the cause of your palpitations, your caregiver will take your history and perform a physical exam. Tests may also be done, including:   Electrocardiography (ECG). This test records the heart's electrical activity.   Cardiac monitoring. This allows your caregiver to monitor your heart rate and rhythm in real time.   Holter monitor. This is a portable device that records your heartbeat and can help diagnose heart arrhythmias. It allows your caregiver to track your heart activity for several days, if needed.   Stress tests by exercise or by giving medicine that makes the heart beat faster.  TREATMENT   Treatment of palpitations depends on the cause of your symptoms and can vary greatly. Most cases of palpitations do not require any treatment other than time, relaxation, and monitoring your symptoms. Other causes, such as atrial fibrillation, atrial flutter, or supraventricular tachycardia, usually require further treatment.  HOME CARE INSTRUCTIONS    Avoid:   Caffeinated coffee, tea, soft drinks, diet pills, and energy drinks.   Chocolate.   Alcohol.   Stop smoking if you smoke.   Reduce your stress and anxiety. Things that can help you relax include:   A method that measures bodily functions so  you can learn to control them (biofeedback).   Yoga.   Meditation.   Physical activity such as swimming, jogging, or walking.   Get plenty of rest and sleep.  SEEK MEDICAL CARE IF:    You continue to have a fast or irregular heartbeat beyond 24 hours.   Your palpitations occur more often.  SEEK IMMEDIATE MEDICAL CARE IF:   You develop chest pain or shortness of breath.   You have a severe headache.   You feel dizzy, or you faint.  MAKE SURE YOU:   Understand these instructions.   Will watch your condition.   Will get help right away if you are not doing well or get worse.  Document Released: 10/24/2000 Document Revised: 02/21/2013 Document Reviewed: 12/26/2011  [employer] Patient Information 2014 [employer]

## 2014-03-23 NOTE — ED Provider Notes (Signed)
CSN: [id number]     Arrival date & time 03/23/14  1835 History  This chart was scribed for Charles B. Bernette Mayers, MD by Bronson Curb, ED Scribe. This patient was seen in room MH10/MH10 and the patient's care was started at 6:51 PM.    Chief Complaint  Patient presents with  . Palpitations     The history is provided by the patient. No language interpreter was used.   HPI Comments: Alicia Carroll is a 80 y.o. Female with history of thoracic aortic aneurysm, HTN, CAD, and CHF who presents to the Emergency Department complaining of gradually worsening palpitations that began this morning. Patient states her heart rate increases and decreases sporadically, "feels irregular" and feels her heart "is not beating right". There is some mild associated SOB. Patient denies any pain. She denies prior history of stent placement or MI.    Past Medical History  Diagnosis Date  . Hypertension   . Hyperlipidemia   . Thoracic aortic aneurysm without mention of rupture 03/21/13    ASCENDING AND DESCENDING, ECTASIA OF AORTIC ARCH  . Diabetes mellitus without complication 03/21/13    "BORDERLINE"  . Cataracts, bilateral   . Congestive heart failure   . Thyroid disease   . Coronary artery disease   . History of nuclear stress test 03/15/2008    dipyridamole; fixed septal defect r/t LBBB; no ischemia, low risk   Past Surgical History  Procedure Laterality Date  . Abdominal hysterectomy    . Cholecystectomy    . Appendectomy    . Cardiac catheterization  04/1995    no evidence of CAD, normal aortic root with no aortic insuff., no MR (Dr. Julieanne Manson)  . Fracture surgery    . Back surgery    . Eye surgery      Bilateral cataract  . Spine surgery    . Shoulder arthroscopy w/ rotator cuff repair Right   . US echocardiography  1996    normal LV size & function, mild aortic insuff.   Family History  Problem Relation Age of Onset  . Kidney cancer Father     also MI  . Stroke Mother     also HTN   . COPD Brother     x2  . Lung cancer Sister   . Heart disease Sister   . Hypertension Sister   . Heart failure Sister   . Rheum arthritis Sister   . Thyroid disease Child     x2  . Heart Problems Child     arrhythmia   History  Substance Use Topics  . Smoking status: Former Smoker    Types: Cigarettes    Quit date: 03/22/1983  . Smokeless tobacco: Never Used  . Alcohol Use: No   OB History   Grav Para Term Preterm Abortions TAB SAB Ect Mult Living                 Review of Systems  Respiratory: Positive for shortness of breath.   Cardiovascular: Positive for palpitations. Negative for chest pain.  All other systems reviewed and are negative.     Allergies  Codeine  Home Medications   Prior to Admission medications   Medication Sig Start Date End Date Taking? Authorizing Provider  amLODipine (NORVASC) 10 MG tablet Take 10 mg by mouth daily.   Yes Historical Provider, MD  aspirin 81 MG tablet Take 81 mg by mouth daily.    Historical Provider, MD  brimonidine (ALPHAGAN P) 0.1 % SOLN Place  2 drops into both eyes 2 (two) times daily.    Historical Provider, MD  Calcium Carb-Cholecalciferol (CALCIUM + D3) 600-200 MG-UNIT TABS Take 1 tablet by mouth daily.    Historical Provider, MD  dexlansoprazole (DEXILANT) 60 MG capsule Take 60 mg by mouth 2 (two) times daily.     Historical Provider, MD  dorzolamide-timolol (COSOPT) 22.3-6.8 MG/ML ophthalmic solution Place 1 drop into both eyes 2 (two) times daily.    Historical Provider, MD  fish oil-omega-3 fatty acids 1000 MG capsule Take 2 g by mouth daily.    Historical Provider, MD  furosemide (LASIX) 40 MG tablet Take 40 mg by mouth daily.    Historical Provider, MD  levothyroxine (SYNTHROID, LEVOTHROID) 25 MCG tablet Take 25 mcg by mouth daily before breakfast.    Historical Provider, MD  lisinopril (PRINIVIL,ZESTRIL) 40 MG tablet Take 40 mg by mouth daily.    Historical Provider, MD  Multiple Vitamin (MULTIVITAMIN) tablet  Take 1 tablet by mouth daily. Centrum Silver    Historical Provider, MD  nadolol (CORGARD) 20 MG tablet Take 20 mg by mouth daily.    Historical Provider, MD  perphenazine-amitriptyline (ETRAFON/TRIAVIL) 2-25 MG TABS Take 1 tablet by mouth daily. Take 1/2 tablet    Historical Provider, MD  Potassium Chloride (KCL-20 PO) Take 1 tablet by mouth daily.    Historical Provider, MD  simvastatin (ZOCOR) 40 MG tablet Take 40 mg by mouth every evening.    Historical Provider, MD  Travoprost, BAK Free, (TRAVATAN) 0.004 % SOLN ophthalmic solution Place 2 drops into both eyes at bedtime.    Historical Provider, MD   Triage Vitals: BP 143/83  Pulse 71  Temp(Src) 99 F (37.2 C) (Oral)  Resp 18  Ht 5\' 3"  (1.6 m)  Wt 140 lb (63.504 kg)  BMI 24.81 kg/m2  SpO2 98%  Physical Exam  Nursing note and vitals reviewed. Constitutional: She is oriented to person, place, and time. She appears well-developed and well-nourished.  HENT:  Head: Normocephalic and atraumatic.  Eyes: EOM are normal. Pupils are equal, round, and reactive to light.  Neck: Normal range of motion. Neck supple.  Cardiovascular: Normal rate, normal heart sounds and intact distal pulses.   Pulmonary/Chest: Effort normal and breath sounds normal.  Abdominal: Bowel sounds are normal. She exhibits no distension. There is no tenderness.  Musculoskeletal: Normal range of motion. She exhibits no edema and no tenderness.  Neurological: She is alert and oriented to person, place, and time. She has normal strength. No cranial nerve deficit or sensory deficit.  Skin: Skin is warm and dry. No rash noted.  Psychiatric: She has a normal mood and affect.    ED Course  Procedures (including critical care time)  DIAGNOSTIC STUDIES: Oxygen Saturation is 98% on room air, normal by my interpretation.    COORDINATION OF CARE: At 1858 Discussed treatment plan with patient which includes CXR. Patient agrees.   Labs Review Labs Reviewed  COMPREHENSIVE  METABOLIC PANEL - Abnormal; Notable for the following:    Glucose, Bld 133 (*)    Total Protein 8.4 (*)    Total Bilirubin <0.2 (*)    GFR calc non Af Amer 79 (*)    All other components within normal limits  CBC  TROPONIN I    Imaging Review Dg Chest 2 View  03/23/2014   CLINICAL DATA:  Shortness of breath  EXAM: CHEST  2 VIEW  COMPARISON:  03/02/2013  FINDINGS: Cardiac shadow is within normal limits. The lungs are  well aerated bilaterally. There remains fusiform dilatation of the descending thoracic aorta. . Degenerative changes of the thoracic spine are noted. No acute abnormality is seen.  IMPRESSION: No acute abnormality noted.   Electronically Signed   By: Alcide CleverMark  Lukens M.D.   On: 03/23/2014 19:14     EKG Interpretation   Date/Time:  Thursday Mar 23 2014 18:51:07 EDT Ventricular Rate:  70 PR Interval:  238 QRS Duration: 148 QT Interval:  462 QTC Calculation: 498 R Axis:   18 Text Interpretation:  Sinus rhythm with 1st degree A-V block Left bundle  branch block Abnormal ECG No old tracing to compare but per Cards notes  has had LBBB before [facility] by [facility]  MD, Leonette MostHARLES [phone]([id number]) on 03/23/2014  7:00:40 PM      MDM   Final diagnoses:  Palpitations    Normal labs and imaging. NSR on EKG. Observed in the ED for about 3 hours without dysrhythmia. Pt is feeling fine and would like to go home. Advised to return or call cardiology if symptoms return or become more severe.   I personally performed the services described in this documentation, which was scribed in my presence. The recorded information has been reviewed and is accurate.      Charles B. Bernette MayersSheldon, MD 03/23/14 2124

## 2014-03-23 NOTE — ED Notes (Signed)
MD at bedside. 

## 2014-03-28 ENCOUNTER — Ambulatory Visit: Payer: Medicare Other | Admitting: Thoracic Surgery (Cardiothoracic Vascular Surgery)

## 2014-03-28 ENCOUNTER — Other Ambulatory Visit: Payer: Medicare Other

## 2014-04-29 IMAGING — CT CT CTA ABD/PEL W/CM AND/OR W/O CM
2 of 7 series · 13 of 46 positions shown, 15 images · IV contrast (CONTRAST)
Comparison: CTA of the chest abdomen and pelvis 02/09/2012.

CTA CHEST

CLINICAL DATA: Follow-up evaluation for known thoracic aortic
aneurysm.

CT ANGIOGRAPHY CHEST, ABDOMEN AND PELVIS, 3-DIMENSIONAL CT IMAGE
RENDERING ON INDEPENDENT WORKSTATION
TECHNIQUE: Multidetector CT imaging through the chest, abdomen and
pelvis was performed using the standard protocol during bolus
administration of intravenous contrast.  Multiplanar reconstructed
images including MIPs were obtained and reviewed to evaluate the
vascular anatomy.  Post processing was performed on an independent
workstation and to create three-dimensional reconstructions.
Contrast: 100mL OMNIPAQUE IOHEXOL 350 MG/ML SOLN

[Series 4: soft tissue · axial · 0.71mm/px · z∈[-558,-48]mm · 10 of 307 slices shown, 12 images]
[im 26/307  soft-tissue]
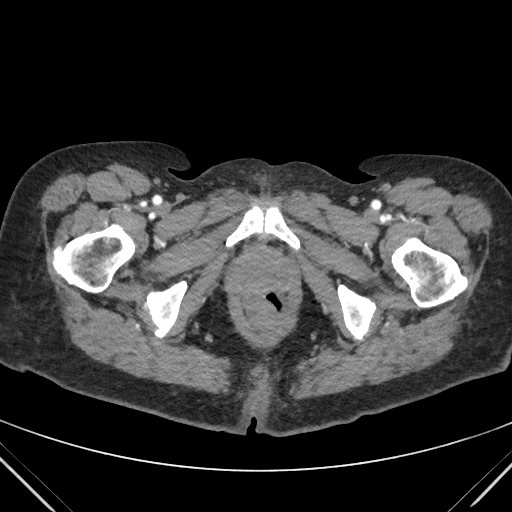
[im 26/307  bone]
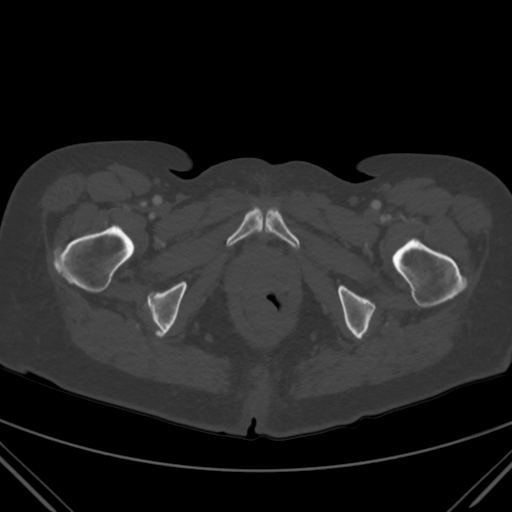
[im 52/307  soft-tissue]
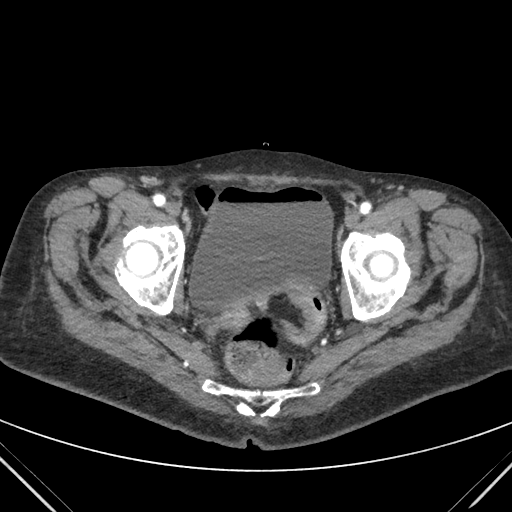
[im 77/307  soft-tissue]
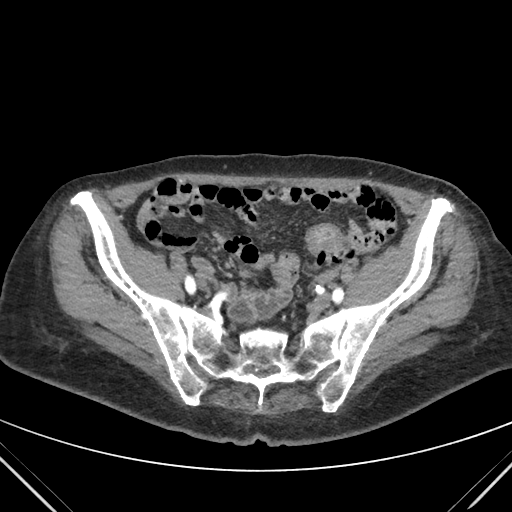
[im 115/307  soft-tissue]
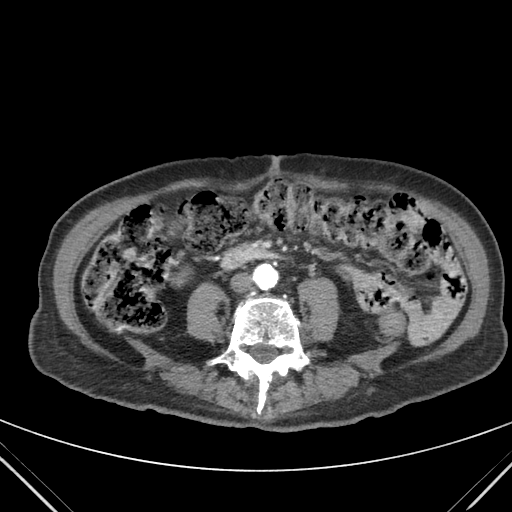
[im 141/307  soft-tissue]
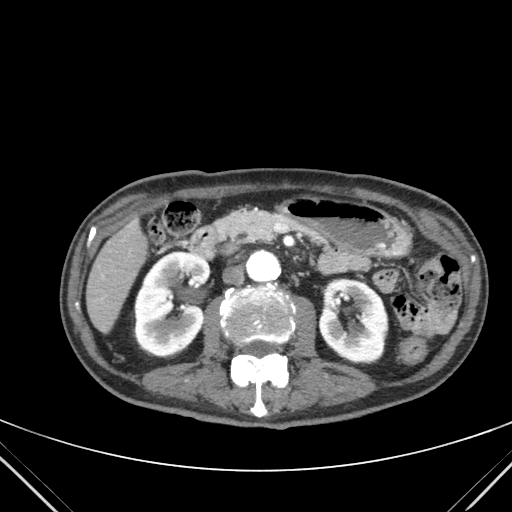
[im 166/307  soft-tissue]
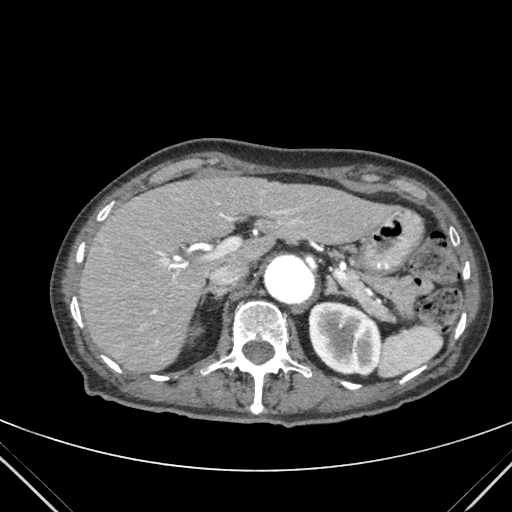
[im 192/307  soft-tissue]
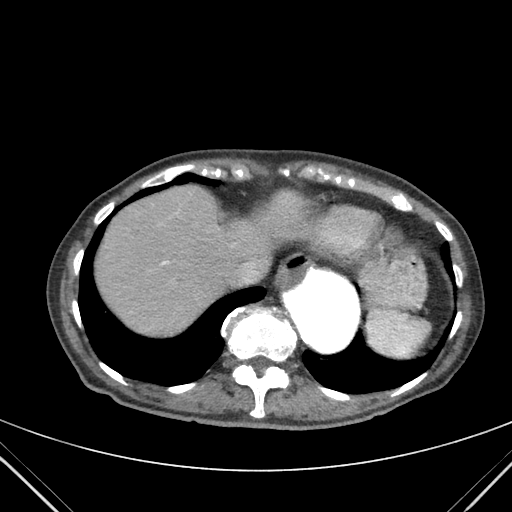
[im 230/307  soft-tissue]
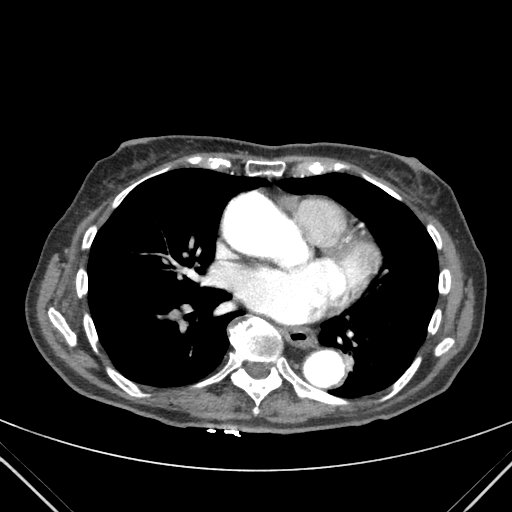
[im 256/307  soft-tissue]
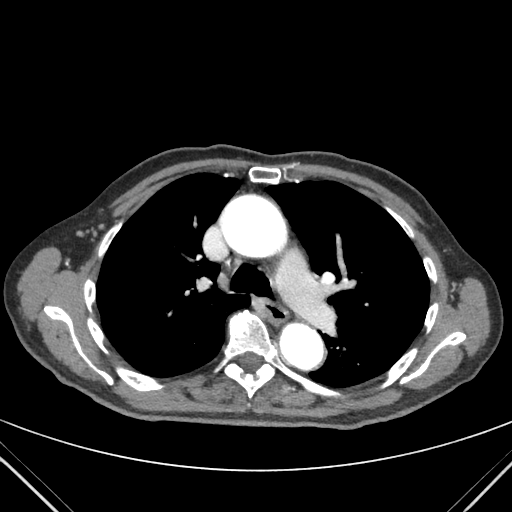
[im 256/307  bone]
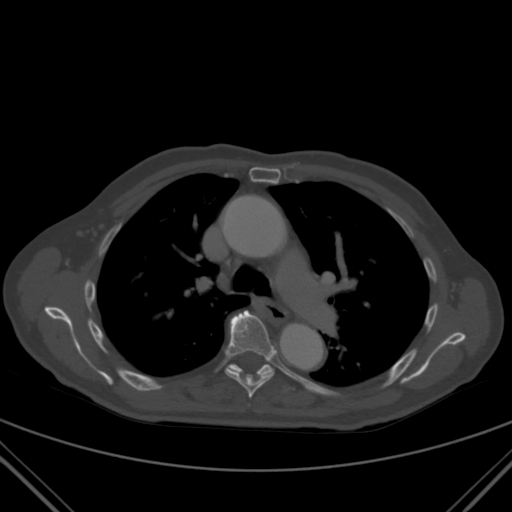
[im 281/307  soft-tissue]
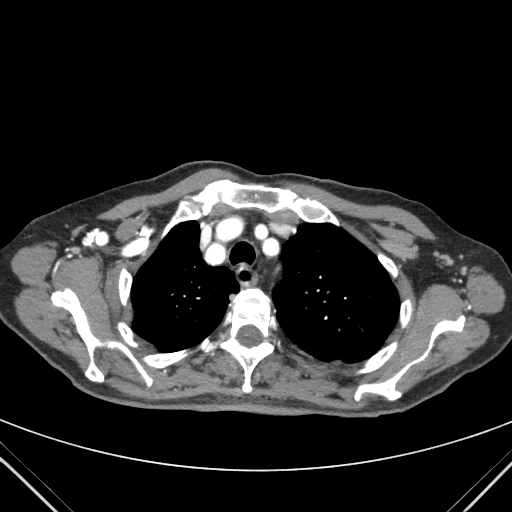

[cor mpr · coronal · 1.19mm/px · 3 of 70 slices shown]
[im 18/70  soft-tissue]
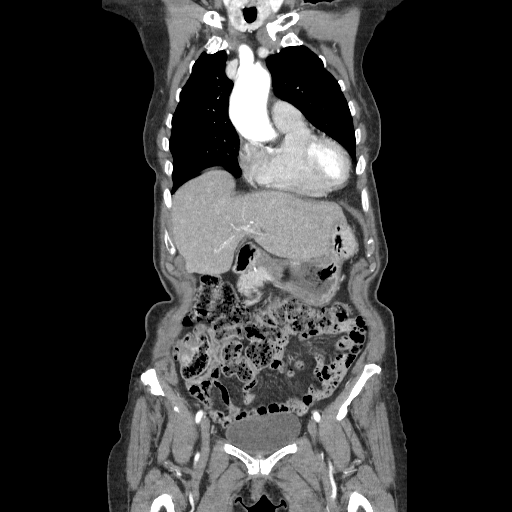
[im 35/70  soft-tissue]
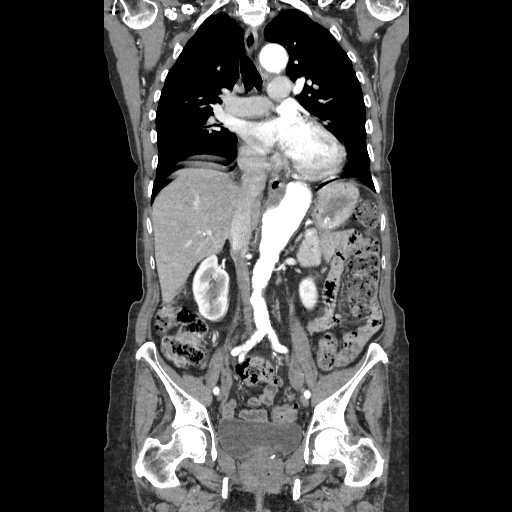
[im 52/70  soft-tissue]
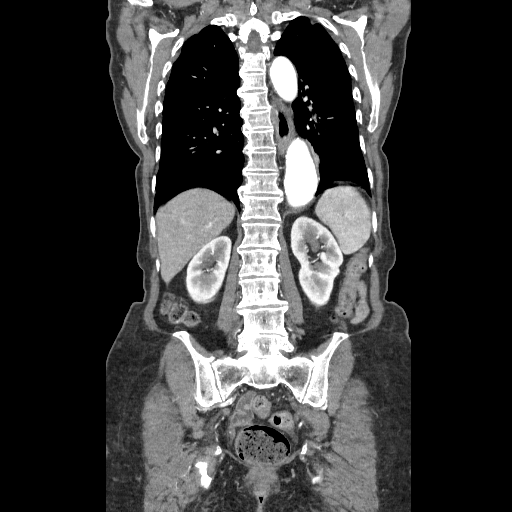

[13 of 46 positions shown; findings below may reference images not displayed]

FINDINGS: Mediastinum: Mild aneurysmal dilatation of the ascending thoracic
aorta (4.6 x 4.8 cm) is noted.  The thoracic aortic arch is ectatic
measuring up to 3.5 cm in diameter.  The descending thoracic aorta
is remarkable for fusiform aneurysmal dilatation which is most
pronounced shortly above the aortic hiatus, where the aorta
measures up to 5.2 x 5.6 cm (outer diameter) when measured
orthogonal to the direction of blood flow on multiplanar
reconstructions.  This appears only slightly increased compared to
the prior study from 02/09/2012, however, along the anteromedial
wall of the aorta as it transitions through the aortic hiatus there
is a new small filling defect which appears to represent some
developing mural thrombus.  No evidence of thoracic aortic
dissection at this time. Heart size is mildly enlarged. There is no
significant pericardial fluid, thickening or pericardial
calcification. There is atherosclerosis of the thoracic aorta, the
great vessels of the mediastinum and the coronary arteries,
including calcified atherosclerotic plaque in the left anterior
descending and right coronary arteries. Mild calcification of the
aortic valve.  Thickening of the esophagus, most severe in the
distal third, concerning for potential esophagitis. No
pathologically enlarged mediastinal or hilar lymph nodes.
Heterogeneous appearing thyroid gland, similar to prior studies.

Lungs/Pleura: No acute consolidative airspace disease.  No pleural
effusions.  No definite suspicious appearing pulmonary nodules or
masses are identified.

Musculoskeletal: There are no aggressive appearing lytic or blastic
lesions noted in the visualized portions of the skeleton.

 Review of the MIP images confirms the above findings.
IMPRESSION: 1.  Fusiform aneurysmal dilatation of the descending thoracic aorta
measuring up to 5.2 x 5.6 cm shortly above the level of the aortic
hiatus.  This is very similar to the prior study from 02/09/2012.
The notable difference, is a small filling defect along the
anteromedial wall of the descending thoracic aorta which appears to
represent some developing mural thrombus.  No thoracic aortic
dissection at this time.
2.  No significant change in mild aneurysmal dilatation of the
ascending thoracic aorta (4.6 x 4.8 cm), or mild ectasia of the
aortic arch (3.5 cm in diameter) compared to the prior examination.
3.  Extensive atherosclerosis, including two-vessel coronary artery
disease.
4.  Thickening of the esophagus, most severe in the distal third.
This is similar to the prior examination, and may be a
manifestation of esophagitis.
5.  Additional incidental findings, as above.

CTA ABDOMEN AND PELVIS
FINDINGS: Abdomen/Pelvis:  Moderate atherosclerosis throughout the abdominal
and pelvic vasculature.  There is some mild fusiform ectasia of the
infrarenal abdominal aorta which measures up to 2.2 x 2.5 cm in
diameter.  No evidence of aneurysm, dissection or major branch
occlusion within the abdominal or pelvic vasculature.  A single
right renal artery.  Two left renal arteries (normal variant)
incidentally noted.

Status post cholecystectomy.  The appearance of the liver,
pancreas, spleen, bilateral adrenal glands and bilateral kidneys is
unremarkable.  No significant volume of ascites.  No
pneumoperitoneum.  No pathologic distension of small bowel.
Numerous colonic diverticula are noted, without surrounding
inflammatory changes to suggest acute diverticulitis at this time.
However, there is some apparent colonic wall thickening in the mid
sigmoid colon, and in the adjacent sigmoid mesocolon there is a
single mildly enlarged lymph node measuring 12 mm in short axis
(image 239 of series 4). There is a partially calcified soft tissue
lesion along the left pelvic sidewall measuring 1.6 x 2.2 cm (image
225 of series 4), which is progressively less apparent on prior
examinations, and has been present on prior studies dating back to
at least 04/12/2004, favored to represent an atrophic left ovary.
Right ovary is not confidently identified, and may be surgically
absent or atrophic.  Status post hysterectomy.  There is a large
amount of gas present in the lumen of the urinary bladder.

Musculoskeletal: There are no aggressive appearing lytic or blastic
lesions noted in the visualized portions of the skeleton.

 Review of the MIP images confirms the above findings.
IMPRESSION: 1.  No acute vascular abnormality in the abdomen or pelvis.
Specifically, no evidence of abdominal aortic aneurysm at this
time.
2.  Focal area of the colonic wall thickening in the mid sigmoid
colon with one adjacent mildly enlarged lymph node in the sigmoid
mesocolon measuring 12 mm in short axis.  These findings are
nonspecific, and given the large amount of diverticular disease,
this could to be chronic related to prior episodes of inflammation.
No current findings to suggest acute diverticulitis are noted at
this time.  However, clinical correlation is recommended, with
consideration for colonoscopy to exclude a colonic neoplasm.
3.  Large amount of gas in the nondependent portion of the urinary
bladder.  If the patient has a history of bladder catheterizations,
this may be iatrogenic.  In the absence of such history, clinical
correlation for signs and symptoms of urinary tract infection are
recommended, as infection with gas forming organisms could result
in a similar appearance.
4.  Status post cholecystectomy.
5.  Additional incidental findings, as above.

## 2014-05-05 ENCOUNTER — Other Ambulatory Visit: Payer: Self-pay | Admitting: *Deleted

## 2014-05-05 ENCOUNTER — Telehealth: Payer: Self-pay | Admitting: Internal Medicine

## 2014-05-05 NOTE — Telephone Encounter (Signed)
Please call,question about her Dioxan.

## 2014-05-05 NOTE — Telephone Encounter (Signed)
RN spoke daughter Claris Che-Margaret. She states her mother-complains of heart "flip /flopping" since stopping digoxin.  The episode occurred today and about 6-8 weeks ago per daughter. No other symptoms. Daughter states mother is convinced it is because she stopped digoxin.  Daughter wanted to know if they can restart digoxin.   Digoxin was discontinue in 12/2013. RN  Informed daughter ,will have to defer to Dr Rennis GoldenHilty. RN asked daughter to get blood presure reading and pulse --call back with information.

## 2014-05-05 NOTE — Telephone Encounter (Signed)
Probably unlikely to be due to that .Marland Kitchen. If she hasn't had any more episodes, I would not worry. If she has had more, she may need a monitor to see if she is having a-fib.  Dr. HRexene Edison

## 2014-05-05 NOTE — Telephone Encounter (Signed)
RN spoke to daughter, information given per Dr Rennis GoldenHILTY.Daughert states her mother informed her of b/p 126/71.  Daughter states she does not remember th exact number. Daughter verbalized understanding. She states she will call back if anymore episodes.

## 2014-06-29 ENCOUNTER — Ambulatory Visit (INDEPENDENT_AMBULATORY_CARE_PROVIDER_SITE_OTHER): Payer: Medicare Other | Admitting: Internal Medicine

## 2014-06-29 ENCOUNTER — Encounter: Payer: Self-pay | Admitting: Internal Medicine

## 2014-06-29 VITALS — BP 160/70 | HR 64 | Ht 63.0 in | Wt 149.0 lb

## 2014-06-29 DIAGNOSIS — E119 Type 2 diabetes mellitus without complications: Secondary | ICD-10-CM

## 2014-06-29 DIAGNOSIS — I716 Thoracoabdominal aortic aneurysm, without rupture, unspecified: Secondary | ICD-10-CM

## 2014-06-29 DIAGNOSIS — Z01810 Encounter for preprocedural cardiovascular examination: Secondary | ICD-10-CM

## 2014-06-29 DIAGNOSIS — E785 Hyperlipidemia, unspecified: Secondary | ICD-10-CM

## 2014-06-29 DIAGNOSIS — I714 Abdominal aortic aneurysm, without rupture, unspecified: Secondary | ICD-10-CM

## 2014-06-29 DIAGNOSIS — I10 Essential (primary) hypertension: Secondary | ICD-10-CM

## 2014-06-29 DIAGNOSIS — I447 Left bundle-branch block, unspecified: Secondary | ICD-10-CM

## 2014-06-29 NOTE — Patient Instructions (Signed)
Your physician has requested that you have a lexiscan myoview. For further information please visit [email or web address]. Please follow instruction sheet, as given.  Your physician wants you to follow-up in: 6 months.  You will receive a reminder letter in the mail two months in advance. If you don't receive a letter, please call our office to schedule the follow-up appointment.  

## 2014-06-30 ENCOUNTER — Telehealth (HOSPITAL_COMMUNITY): Payer: Self-pay

## 2014-06-30 ENCOUNTER — Encounter: Payer: Self-pay | Admitting: Internal Medicine

## 2014-06-30 NOTE — Progress Notes (Signed)
OFFICE NOTE  Chief Complaint:  Routine follow-up  Primary Care Physician: Nino GlowBURCIU,CATALIN, MD  HPI:  Alicia Carroll is a pleasant 80 year old female is following up today on her asymptomatic ascending and descending aortic aneurysms. The descending aortic aneurysm measured about 4.6 cm in April 2013 and the descending aortic aneurysm measures 6.5 x 5.1 cm at the aortic hiatus. She was referred to Dr. Robby SermonHendricks in for evaluation of her aneurysms, and he felt that her thoracic aneurysm was stable and required only monitoring while her abdominal aneurysm could possibly be fixed with a stent graft. She was then referred to Dr. Myra GianottiBrabham who thought that she may benefit from a fenestrated graft and he referred [facility] to [facility]. She was apparently evaluated there and underwent another series of scans and was offered surgery versus waiting for 6 months. She elected to wait for 6 months and undergo another set of scans to see if there is any significant change. She has a history of left bundle-branch block which has been stable and hypertension which is well controlled.    She recently had what sounds like an upper respiratory infection around the time that 2 family members either had pneumonia or the flu. With this she had chest congestion and some tightness in her chest and she [facility] [place]. Apparently they thought she was having acute MI at that time based on her left bundle branch block, and failed to contact us to discover that this was an old finding. She was heparinized and admitted for rule out coronary syndrome, which she did. Ultimately she was diagnosed with a viral upper respiratory infection. They did however recommend outpatient stress testing. Her last stress test was in 2009 and was nonischemic. She has had no further chest tightness. She was seen about 2 weeks ago [facility] and they're continuing to monitor her  aneurysm which is stable. This is in contrast to a CT scan that was [facility], indicating that her aneurysm had increased to 6.8 cm-what it actually has been fairly stable.  [name] returns today for followup. She has subsequently been closely followed [facility] and they have now performed a CT that are necessary to constructor custom fenestrated graft. She is hopeful to have repair of her aneurysm in the next few months. As mentioned previously her last stress test was over 5 years ago and it is difficult to assess her risk for surgery. Knowing that vascular surgery is the highest risk procedure one could undergo, it would behoove her to undergo stress testing prior to surgery to further assess her risk.  PMHx:  Past Medical History  Diagnosis Date  . Hypertension   . Hyperlipidemia   . Thoracic aortic aneurysm without mention of rupture 03/21/13    ASCENDING AND DESCENDING, ECTASIA OF AORTIC ARCH  . Diabetes mellitus without complication 03/21/13    "BORDERLINE"  . Cataracts, bilateral   . Congestive heart failure   . Thyroid disease   . Coronary artery disease   . History of nuclear stress test 03/15/2008    dipyridamole; fixed septal defect r/t LBBB; no ischemia, low risk    Past Surgical History  Procedure Laterality Date  . Abdominal hysterectomy    . Cholecystectomy    . Appendectomy    . Cardiac catheterization  04/1995    no evidence of CAD, normal aortic root with no aortic insuff.,  no MR (Dr. Julieanne Manson)  . Fracture surgery    . Back surgery    . Eye surgery      Bilateral cataract  . Spine surgery    . Shoulder arthroscopy w/ rotator cuff repair Right   . US echocardiography  1996    normal LV size & function, mild aortic insuff.    FAMHx:  Family History  Problem Relation Age of Onset  . Kidney cancer Father     also MI  . Stroke Mother     also HTN  . COPD Brother     x2  . Lung cancer Sister   . Heart disease Sister   .  Hypertension Sister   . Heart failure Sister   . Rheum arthritis Sister   . Thyroid disease Child     x2  . Heart Problems Child     arrhythmia    SOCHx:   reports that she quit smoking about 31 years ago. Her smoking use included Cigarettes. She smoked 0.00 packs per day. She has never used smokeless tobacco. She reports that she does not drink alcohol or use illicit drugs.  ALLERGIES:  Allergies  Allergen Reactions  . Codeine Nausea And Vomiting    ROS: A comprehensive review of systems was negative.  HOME MEDS: Current Outpatient Prescriptions  Medication Sig Dispense Refill  . amLODipine (NORVASC) 10 MG tablet Take 10 mg by mouth daily.      Marland Kitchen aspirin 81 MG tablet Take 81 mg by mouth daily.      . brimonidine (ALPHAGAN P) 0.1 % SOLN Place 2 drops into both eyes 2 (two) times daily.      . Calcium Carb-Cholecalciferol (CALCIUM + D3) 600-200 MG-UNIT TABS Take 1 tablet by mouth daily.      Marland Kitchen dexlansoprazole (DEXILANT) 60 MG capsule Take 60 mg by mouth 2 (two) times daily.       . dorzolamide-timolol (COSOPT) 22.3-6.8 MG/ML ophthalmic solution Place 1 drop into both eyes 2 (two) times daily.      . fish oil-omega-3 fatty acids 1000 MG capsule Take 2 g by mouth daily.      . furosemide (LASIX) 40 MG tablet Take 40 mg by mouth daily.      Marland Kitchen levothyroxine (SYNTHROID, LEVOTHROID) 25 MCG tablet Take 25 mcg by mouth daily before breakfast.      . lisinopril (PRINIVIL,ZESTRIL) 40 MG tablet Take 40 mg by mouth daily.      . Multiple Vitamin (MULTIVITAMIN) tablet Take 1 tablet by mouth daily. Centrum Silver      . Multiple Vitamins-Minerals (CENTRUM SILVER PO) Take by mouth.      . nadolol (CORGARD) 20 MG tablet Take 20 mg by mouth daily.      Marland Kitchen perphenazine-amitriptyline (ETRAFON/TRIAVIL) 2-25 MG TABS Take 1 tablet by mouth daily. Take 1/2 tablet      . Potassium Chloride (KCL-20 PO) Take 1 tablet by mouth daily.      . simvastatin (ZOCOR) 40 MG tablet Take 40 mg by mouth every  evening.      . Travoprost, BAK Free, (TRAVATAN) 0.004 % SOLN ophthalmic solution Place 2 drops into both eyes at bedtime.       No current facility-administered medications for this visit.    LABS/IMAGING: No results found for this or any previous visit (from the past 48 hour(s)). No results found.  VITALS: BP 160/70  Pulse 64  Ht 5\' 3"  (1.6 m)  Wt 149 lb (67.586 kg)  BMI 26.40 kg/m2  EXAM: General appearance: alert and no distress Lungs: clear to auscultation bilaterally Heart: regular rate and rhythm, S1, S2 normal, no murmur, click, rub or gallop Extremities: extremities normal, atraumatic, no cyanosis or edema Pulses: 2+ and symmetric Neurologic: Grossly normal  EKG: Normal sinus rhythm at 64 with a left bundle branch block and first degree AV block (PR interval 230 ms)  ASSESSMENT: 1. Stable thoracic and abdominal aneurysms-now followed at [facility] - close to endovascular repair 2. Hypertension-controlled 3. Left bundle branch block-stable  PLAN: 1.   Mrs. Zuniga has both thoracic and throughout the abdominal aneurysms, the latter being more significant measuring now 5.8 cm in diameter and involving both the superior mesenteric and celiac vessels.  She was referred to [facility] for evaluation of a fenestrated graft and is felt to be a good candidate for that and is getting close to endovascular repair. I would recommend a LexiScan nuclear stress test prior to surgery to risk stratify her as her last test was over 5 years ago. She is currently asymptomatic however has a left bundle branch block and is undergoing a high risk procedure. She is unable to exercise and her EKG would be uninterpretable.  Chrystie Nose, MD, [facility] Attending Cardiologist [facility]  HILTY,Kenneth C 06/30/2014, 8:48 AM

## 2014-06-30 NOTE — Telephone Encounter (Signed)
Encounter complete. 

## 2014-07-05 ENCOUNTER — Ambulatory Visit (HOSPITAL_COMMUNITY)
Admission: RE | Admit: 2014-07-05 | Discharge: 2014-07-05 | Disposition: A | Discharge status: Home / Self Care | Payer: Medicare Other | Source: Ambulatory Visit | Attending: Cardiovascular Disease | Admitting: Cardiovascular Disease

## 2014-07-05 DIAGNOSIS — Z01810 Encounter for preprocedural cardiovascular examination: Secondary | ICD-10-CM | POA: Insufficient documentation

## 2014-07-05 DIAGNOSIS — I714 Abdominal aortic aneurysm, without rupture, unspecified: Secondary | ICD-10-CM

## 2014-07-05 DIAGNOSIS — I447 Left bundle-branch block, unspecified: Secondary | ICD-10-CM | POA: Diagnosis present

## 2014-07-05 MED ORDER — REGADENOSON 0.4 MG/5ML IV SOLN
0.4000 mg | Freq: Once | INTRAVENOUS | Status: AC
  Administered 2014-07-05: 0.4 mg via INTRAVENOUS

## 2014-07-05 MED ORDER — AMINOPHYLLINE 25 MG/ML IV SOLN
75.0000 mg | Freq: Once | INTRAVENOUS | Status: AC
  Administered 2014-07-05: 75 mg via INTRAVENOUS

## 2014-07-05 MED ORDER — TECHNETIUM TC 99M SESTAMIBI GENERIC - CARDIOLITE
30.2000 | Freq: Once | INTRAVENOUS | Status: AC | PRN
  Administered 2014-07-05: 30.2 via INTRAVENOUS

## 2014-07-05 MED ORDER — TECHNETIUM TC 99M SESTAMIBI GENERIC - CARDIOLITE
10.8000 | Freq: Once | INTRAVENOUS | Status: AC | PRN
  Administered 2014-07-05: 11 via INTRAVENOUS

## 2014-07-05 NOTE — Procedures (Addendum)
[facility] [address] [place] [address] [phone]  Cardiology Nuclear Med Study  Alicia Carroll is a 80 y.o. female     MRN : [id number]     DOB: 05-14-1929  Procedure Date: 07/05/2014  Nuclear Med Background Indication for Stress Test:  Surgical Clearance and Abnormal EKG History:  CAD;CHF;No prior respiratory history reported;Last NUC MPI on 05/061996-nonischemic;ECHO in 1996-normal LV function. Cardiac Risk Factors: Family History - CAD, History of Smoking, Hypertension, LBBB, Lipids, PVD and AAA  Symptoms:  Palpitations   Nuclear Pre-Procedure Caffeine/Decaff Intake:  7:00pm NPO After: 5:00am   IV Site: R Antecubital  IV 0.9% NS with Angio Cath:  22g  Chest Size (in):  n/a IV Started by: Berdie Ogren, RN  Height:  (1.6 m)  Cup Size: A  BMI:  Body mass index is 26.4 kg/(m^2). Weight:  149 lb (67.586 kg)   Tech Comments:  n/a    Nuclear Med Study 1 or 2 day study: 1 day  Stress Test Type:  Lexiscan  Order Authorizing Provider:  Zoila Shutter, MD   Resting Radionuclide: Technetium 55m Sestamibi  Resting Radionuclide Dose: 10.8 mCi   Stress Radionuclide:  Technetium 42m Sestamibi  Stress Radionuclide Dose: 30.2 mCi           Stress Protocol Rest HR:71 Stress HR: 100  Rest BP: 155/95 Stress BP:176/94  Exercise Time (min): n/a METS: n/a          Dose of Adenosine (mg):  n/a Dose of Lexiscan: 0.4 mg  Dose of Atropine (mg): n/a Dose of Dobutamine: n/a mcg/kg/min (at max HR)  Stress Test Technologist: Ernestene Mention, CCT Nuclear Technologist: Gonzella Lex, CNMT   Rest Procedure:  Myocardial perfusion imaging was performed at rest 45 minutes following the intravenous administration of Technetium 47m Sestamibi. Stress Procedure:  The patient received IV Lexiscan 0.4 mg over 15-seconds.  Technetium 44m Sestamibi injected at 30-seconds. Patient experienced shortness of breath, chest tightness, nausea and  was administered 75 mg of Aminophylline IV at 5 minutes. There were no significant changes with Lexiscan.  Quantitative spect images were obtained after a 45 minute delay.  Transient Ischemic Dilatation (Normal <1.22):  1.01  QGS EDV:  102 ml QGS ESV:  37 ml LV Ejection Fraction: 64%        Rest ECG: NSR, first degree AV block, LBBB.  Stress ECG: Uninteretable due to baseline LBBB  QPS Raw Data Images:  There is interference from nuclear activity from structures below the diaphragm. This does not affect the ability to read the study. Stress Images:  There is decreased uptake in the septum. Rest Images:  There is decreased uptake in the septum. Subtraction (SDS):  No evidence of ischemia.  Impression Exercise Capacity:  Lexiscan with no exercise. BP Response:  Normal blood pressure response. Clinical Symptoms:  There is chest pain and dyspnea. ECG Impression:  Baseline:  LBBB.  EKG uninterpretable due to LBBB at rest and stress. Comparison with Prior Nuclear Study: Compared to 03/15/08, septal defect more prominent.  Overall Impression:  Low risk stress nuclear study with a moderate size, moderate intensity, fixed septal defect most likely related to LBBB; no ischemia.  LV Wall Motion:  NL LV Function; NL Wall Motion   Olga Millers, MD  07/05/2014 1:14 PM

## 2014-12-18 ENCOUNTER — Ambulatory Visit (INDEPENDENT_AMBULATORY_CARE_PROVIDER_SITE_OTHER): Payer: Medicare Other | Admitting: Internal Medicine

## 2014-12-18 ENCOUNTER — Encounter: Payer: Self-pay | Admitting: Internal Medicine

## 2014-12-18 VITALS — BP 152/74 | HR 76 | Ht 63.0 in | Wt 153.1 lb

## 2014-12-18 DIAGNOSIS — Z9889 Other specified postprocedural states: Secondary | ICD-10-CM

## 2014-12-18 DIAGNOSIS — I712 Thoracic aortic aneurysm, without rupture, unspecified: Secondary | ICD-10-CM

## 2014-12-18 DIAGNOSIS — E785 Hyperlipidemia, unspecified: Secondary | ICD-10-CM

## 2014-12-18 DIAGNOSIS — I10 Essential (primary) hypertension: Secondary | ICD-10-CM

## 2014-12-18 DIAGNOSIS — I447 Left bundle-branch block, unspecified: Secondary | ICD-10-CM

## 2014-12-18 DIAGNOSIS — Z8679 Personal history of other diseases of the circulatory system: Secondary | ICD-10-CM

## 2014-12-18 NOTE — Progress Notes (Signed)
OFFICE NOTE  Chief Complaint:  Routine follow-up  Primary Care Physician: Nino GlowBURCIU,CATALIN, MD  HPI:  Alicia Carroll is a pleasant 81 year old female is following up today on her asymptomatic ascending and descending aortic aneurysms. The descending aortic aneurysm measured about 4.6 cm in April 2013 and the descending aortic aneurysm measures 6.5 x 5.1 cm at the aortic hiatus. She was referred to Dr. Robby SermonHendricks in for evaluation of her aneurysms, and he felt that her thoracic aneurysm was stable and required only monitoring while her abdominal aneurysm could possibly be fixed with a stent graft. She was then referred to Dr. Myra GianottiBrabham who thought that she may benefit from a fenestrated graft and he referred her to [facility]. She was apparently evaluated there and underwent another series of scans and was offered surgery versus waiting for 6 months. She elected to wait for 6 months and undergo another set of scans to see if there is any significant change. She has a history of left bundle-branch block which has been stable and hypertension which is well controlled.    She recently had what sounds like an upper respiratory infection around the time that 2 family members either had pneumonia or the flu. With this she had chest congestion and some tightness in her chest and she presented to the emergency room at [facility] in [place]. Apparently they thought she was having acute MI at that time based on her left bundle branch block, and failed to contact us to discover that this was an old finding. She was heparinized and admitted for rule out coronary syndrome, which she did. Ultimately she was diagnosed with a viral upper respiratory infection. They did however recommend outpatient stress testing. Her last stress test was in 2009 and was nonischemic. She has had no further chest tightness. She was seen about 2 weeks ago back at [facility] and they're continuing to monitor her  aneurysm which is stable. This is in contrast to a CT scan that was performed at [facility], indicating that her aneurysm had increased to 6.8 cm-what it actually has been fairly stable.  [name] returns today for followup. She has subsequently been closely followed at [facility] and they have now performed a CT that are necessary to constructor custom fenestrated graft. She is hopeful to have repair of her aneurysm in the next few months. As mentioned previously her last stress test was over 5 years ago and it is difficult to assess her risk for surgery. Knowing that vascular surgery is the highest risk procedure one could undergo, it would behoove her to undergo stress testing prior to surgery to further assess her risk.  I saw [name] back today in follow-up. In September and November 2015 she underwent endovascular repair of thoracic oh abdominal aortic aneurysms. This is performed at [facility]. 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 This is expected to improve and was associated with the need to move some nerves to her left hand. Overall she is feeling great and is under much less stress now that she's had her aneurysm repaired. The only other changes she was switched from simvastatin over to atorvastatin.  PMHx:  Past Medical History  Diagnosis Date  . Hypertension   . Hyperlipidemia   . Thoracic aortic aneurysm without mention of rupture 03/21/13    ASCENDING AND DESCENDING, ECTASIA OF AORTIC ARCH  . Diabetes mellitus  without complication 03/21/13    "BORDERLINE"  . Cataracts, bilateral   . Congestive heart failure   . Thyroid disease   . Coronary artery disease   . History of nuclear stress test 03/15/2008    dipyridamole; fixed septal defect r/t LBBB; no ischemia, low risk    Past Surgical History  Procedure Laterality Date  . Abdominal hysterectomy    . Cholecystectomy    . Appendectomy    . Cardiac catheterization   04/1995    no evidence of CAD, normal aortic root with no aortic insuff., no MR (Dr. Julieanne Manson)  . Fracture surgery    . Back surgery    . Eye surgery      Bilateral cataract  . Spine surgery    . Shoulder arthroscopy w/ rotator cuff repair Right   . US echocardiography  1996    normal LV size & function, mild aortic insuff.  . Abdominal aorta stent  Sept/Nov 2015    [facility] Self-Expanding Stent [facility]) - MRI contraindication    FAMHx:  Family History  Problem Relation Age of Onset  . Kidney cancer Father     also MI  . Stroke Mother     also HTN  . COPD Brother     x2  . Lung cancer Sister   . Heart disease Sister   . Hypertension Sister   . Heart failure Sister   . Rheum arthritis Sister   . Thyroid disease Child     x2  . Heart Problems Child     arrhythmia    SOCHx:   reports that she quit smoking about 31 years ago. Her smoking use included Cigarettes. She has never used smokeless tobacco. She reports that she does not drink alcohol or use illicit drugs.  ALLERGIES:  Allergies  Allergen Reactions  . Codeine Nausea And Vomiting    ROS: A comprehensive review of systems was negative except for: Neurological: positive for paresthesia  HOME MEDS: Current Outpatient Prescriptions  Medication Sig Dispense Refill  . amLODipine (NORVASC) 10 MG tablet Take 10 mg by mouth daily.    Marland Kitchen aspirin 81 MG tablet Take 81 mg by mouth daily.    Marland Kitchen atorvastatin (LIPITOR) 40 MG tablet Take 40 mg by mouth daily.  0  . brimonidine (ALPHAGAN P) 0.1 % SOLN Place 2 drops into both eyes 2 (two) times daily.    . Calcium Carb-Cholecalciferol (CALCIUM + D3) 600-200 MG-UNIT TABS Take 1 tablet by mouth daily.    Marland Kitchen dexlansoprazole (DEXILANT) 60 MG capsule Take 60 mg by mouth 2 (two) times daily.     . dorzolamide-timolol (COSOPT) 22.3-6.8 MG/ML ophthalmic solution Place 1 drop into both eyes 2 (two) times daily.    . fish oil-omega-3 fatty acids 1000 MG capsule Take 2 g by  mouth daily.    . furosemide (LASIX) 40 MG tablet Take 40 mg by mouth daily.    Marland Kitchen levothyroxine (SYNTHROID, LEVOTHROID) 25 MCG tablet Take 25 mcg by mouth daily before breakfast.    . lisinopril (PRINIVIL,ZESTRIL) 40 MG tablet Take 40 mg by mouth daily.    . Multiple Vitamin (MULTIVITAMIN) tablet Take 1 tablet by mouth daily. Centrum Silver    . Multiple Vitamins-Minerals (CENTRUM SILVER PO) Take by mouth.    . nadolol (CORGARD) 20 MG tablet Take 20 mg by mouth daily.    Marland Kitchen perphenazine-amitriptyline (ETRAFON/TRIAVIL) 2-25 MG TABS Take 1 tablet by mouth daily. Take 1/2 tablet    . Potassium  Chloride (KCL-20 PO) Take 1 tablet by mouth daily.    . traMADol (ULTRAM) 50 MG tablet as needed.  0  . Travoprost, BAK Free, (TRAVATAN) 0.004 % SOLN ophthalmic solution Place 2 drops into both eyes at bedtime.     No current facility-administered medications for this visit.    LABS/IMAGING: No results found for this or any previous visit (from the past 48 hour(s)). No results found.  VITALS: BP 152/74 mmHg  Pulse 76  Ht  (1.6 m)  Wt 153 lb 1.6 oz (69.446 kg)  BMI 27.13 kg/m2  EXAM: General appearance: alert and no distress Lungs: clear to auscultation bilaterally Heart: regular rate and rhythm, S1, S2 normal, no murmur, click, rub or gallop Extremities: extremities normal, atraumatic, no cyanosis or edema Pulses: 2+ and symmetric Neurologic: Grossly normal  EKG: Sinus rhythm with first-degree AV block at 76  ASSESSMENT: 1. Thoracic and abdominal aneurysms s/p endovascular repair at [facility] 07/2014 and 09/2014 2. Hypertension-controlled 3. Left bundle branch block-stable 4. Dyslipidemia  PLAN: 1.   Mrs. Bradham did really well with her endovascular stent repair of her thoracic and abdominal aortic aneurysms. Blood pressure remains well controlled at home although was slightly elevated today. She was switched over to Lipitor and recently had blood work performed at her primary  care doctor's office. I will review that. Her left bundle branch block remained stable. She had a stress test last year preoperatively which was negative for ischemia. She wishes to follow-up annually which is okay with me and she should contact me if she needs to be seen sooner.  Chrystie Nose, MD, [facility] Attending Cardiologist [facility]  Jaime Dome C 12/18/2014, 8:41 AM

## 2014-12-18 NOTE — Patient Instructions (Signed)
Dr Hilty recommends that you schedule a follow-up appointment in 1 year. You will receive a reminder letter in the mail two months in advance. If you don't receive a letter, please call our office to schedule the follow-up appointment. 

## 2015-11-15 ENCOUNTER — Telehealth: Payer: Self-pay | Admitting: Internal Medicine

## 2015-11-15 NOTE — Telephone Encounter (Signed)
[name] is calling because Alicia Carroll is supposed to have surgery and the surgeon is trying to send her to another cardiologist , and wants to speak to a nurse about this . Please call   Thanks

## 2015-11-15 NOTE — Telephone Encounter (Signed)
Phone goes straight to VM. LMTCB.

## 2015-11-15 NOTE — Telephone Encounter (Signed)
Pt's daughter is returning Nathan's call   Thanks

## 2015-11-15 NOTE — Telephone Encounter (Signed)
Spoke to daughter - she explains her mother has a new cancer dx and is being scheduled for a procedure on [place]rd [place]. She was referred to a cardiologist in [place] for the clearance - she does not know name of this cardiologist but pt went today and she is waiting to hear about this.  Explained since pt already had this appt, just to keep us in the loop regarding progress & if they need anything from us, to call.  Informed her Dr. Rennis GoldenHilty or other provider could see her here for clearance if needed. Advised to call if further needs.

## 2015-12-19 ENCOUNTER — Ambulatory Visit: Payer: Medicare Other | Admitting: Internal Medicine

## 2017-06-19 ENCOUNTER — Ambulatory Visit
Admission: RE | Admit: 2017-06-19 | Discharge: 2017-06-19 | Disposition: A | Discharge status: Home / Self Care | Payer: MEDICARE

## 2017-06-19 ENCOUNTER — Ambulatory Visit
Admission: RE | Admit: 2017-06-19 | Discharge: 2017-06-19 | Disposition: A | Discharge status: Home / Self Care | Payer: MEDICARE | Attending: Gynecologic Oncology | Admitting: Gynecologic Oncology

## 2017-06-19 DIAGNOSIS — C482 Malignant neoplasm of peritoneum, unspecified: Secondary | ICD-10-CM

## 2017-09-03 ENCOUNTER — Ambulatory Visit
Admission: RE | Admit: 2017-09-03 | Discharge: 2017-09-03 | Disposition: A | Discharge status: Home / Self Care | Payer: MEDICARE

## 2017-09-03 ENCOUNTER — Ambulatory Visit: Admission: RE | Admit: 2017-09-03 | Discharge: 2017-09-03 | Disposition: A | Discharge status: Home / Self Care

## 2017-09-03 DIAGNOSIS — R9389 Abnormal findings on diagnostic imaging of other specified body structures: Principal | ICD-10-CM

## 2017-09-03 DIAGNOSIS — J40 Bronchitis, not specified as acute or chronic: Principal | ICD-10-CM

## 2017-10-08 ENCOUNTER — Ambulatory Visit
Admission: RE | Admit: 2017-10-08 | Discharge: 2017-10-08 | Disposition: A | Discharge status: Home / Self Care | Payer: MEDICARE | Attending: Vascular Surgery | Admitting: Vascular Surgery

## 2017-10-08 ENCOUNTER — Ambulatory Visit
Admission: RE | Admit: 2017-10-08 | Discharge: 2017-10-08 | Disposition: A | Discharge status: Home / Self Care | Payer: MEDICARE

## 2017-10-08 ENCOUNTER — Ambulatory Visit
Admission: RE | Admit: 2017-10-08 | Discharge: 2017-10-08 | Disposition: A | Discharge status: Home / Self Care | Payer: MEDICARE | Attending: Family | Admitting: Family

## 2017-10-08 DIAGNOSIS — I716 Thoracoabdominal aortic aneurysm, without rupture: Principal | ICD-10-CM

## 2017-10-08 DIAGNOSIS — E041 Nontoxic single thyroid nodule: Secondary | ICD-10-CM

## 2017-10-23 ENCOUNTER — Ambulatory Visit
Admission: RE | Admit: 2017-10-23 | Discharge: 2017-10-23 | Disposition: A | Discharge status: Home / Self Care | Payer: MEDICARE

## 2017-10-23 ENCOUNTER — Ambulatory Visit
Admission: RE | Admit: 2017-10-23 | Discharge: 2017-10-23 | Disposition: A | Discharge status: Home / Self Care | Payer: MEDICARE | Attending: Gynecologic Oncology | Admitting: Gynecologic Oncology

## 2017-10-23 DIAGNOSIS — C482 Malignant neoplasm of peritoneum, unspecified: Principal | ICD-10-CM

## 2018-01-29 ENCOUNTER — Ambulatory Visit
Admit: 2018-01-29 | Discharge: 2018-01-29 | Payer: MEDICARE | Attending: Gynecologic Oncology | Primary: Gynecologic Oncology

## 2018-01-29 ENCOUNTER — Ambulatory Visit: Admit: 2018-01-29 | Discharge: 2018-01-29 | Payer: MEDICARE

## 2018-01-29 DIAGNOSIS — C482 Malignant neoplasm of peritoneum, unspecified: Principal | ICD-10-CM

## 2018-01-29 DIAGNOSIS — C786 Secondary malignant neoplasm of retroperitoneum and peritoneum: Principal | ICD-10-CM

## 2018-02-08 MED ORDER — ONDANSETRON HCL 8 MG TABLET
ORAL_TABLET | Freq: Three times a day (TID) | ORAL | 2 refills | 0 days | Status: SS | PRN
Start: 2018-02-08 — End: 2018-05-11

## 2018-02-08 MED ORDER — PROCHLORPERAZINE MALEATE 10 MG TABLET
ORAL_TABLET | Freq: Four times a day (QID) | ORAL | 2 refills | 0 days | Status: SS | PRN
Start: 2018-02-08 — End: 2018-05-11

## 2018-02-08 MED ORDER — PYRIDOXINE (VITAMIN B6) 100 MG TABLET
ORAL_TABLET | Freq: Every day | ORAL | 0 refills | 0.00000 days | Status: CP
Start: 2018-02-08 — End: 2018-03-10

## 2018-02-09 ENCOUNTER — Ambulatory Visit: Admit: 2018-02-09 | Discharge: 2018-02-10 | Payer: MEDICARE

## 2018-02-09 DIAGNOSIS — C229 Malignant neoplasm of liver, not specified as primary or secondary: Principal | ICD-10-CM

## 2018-02-09 DIAGNOSIS — C482 Malignant neoplasm of peritoneum, unspecified: Secondary | ICD-10-CM

## 2018-02-22 MED ORDER — GABAPENTIN 300 MG CAPSULE
ORAL_CAPSULE | Freq: Every evening | ORAL | 2 refills | 0 days | Status: SS
Start: 2018-02-22 — End: 2018-05-11

## 2018-03-02 ENCOUNTER — Ambulatory Visit: Admit: 2018-03-02 | Discharge: 2018-03-03 | Payer: MEDICARE

## 2018-03-02 DIAGNOSIS — C482 Malignant neoplasm of peritoneum, unspecified: Secondary | ICD-10-CM

## 2018-03-02 DIAGNOSIS — C229 Malignant neoplasm of liver, not specified as primary or secondary: Principal | ICD-10-CM

## 2018-03-02 DIAGNOSIS — C569 Malignant neoplasm of unspecified ovary: Secondary | ICD-10-CM

## 2018-03-18 ENCOUNTER — Ambulatory Visit: Admit: 2018-03-18 | Discharge: 2018-03-18 | Payer: MEDICARE

## 2018-03-18 DIAGNOSIS — C482 Malignant neoplasm of peritoneum, unspecified: Secondary | ICD-10-CM

## 2018-03-18 DIAGNOSIS — C229 Malignant neoplasm of liver, not specified as primary or secondary: Principal | ICD-10-CM

## 2018-03-18 DIAGNOSIS — C569 Malignant neoplasm of unspecified ovary: Secondary | ICD-10-CM

## 2018-04-29 ENCOUNTER — Ambulatory Visit: Admit: 2018-04-29 | Discharge: 2018-04-30 | Payer: MEDICARE

## 2018-04-29 DIAGNOSIS — C482 Malignant neoplasm of peritoneum, unspecified: Secondary | ICD-10-CM

## 2018-04-29 DIAGNOSIS — C229 Malignant neoplasm of liver, not specified as primary or secondary: Principal | ICD-10-CM

## 2018-04-29 DIAGNOSIS — C569 Malignant neoplasm of unspecified ovary: Secondary | ICD-10-CM

## 2018-05-11 ENCOUNTER — Ambulatory Visit
Admit: 2018-05-11 | Discharge: 2018-05-12 | Disposition: A | Discharge status: Home / Self Care | Payer: MEDICARE | Source: Other Acute Inpatient Hospital | Admitting: Specialist

## 2018-05-11 DIAGNOSIS — T82898A Other specified complication of vascular prosthetic devices, implants and grafts, initial encounter: Principal | ICD-10-CM

## 2018-05-12 DIAGNOSIS — T82898A Other specified complication of vascular prosthetic devices, implants and grafts, initial encounter: Principal | ICD-10-CM

## 2018-05-18 ENCOUNTER — Ambulatory Visit: Admit: 2018-05-18 | Discharge: 2018-05-18 | Payer: MEDICARE

## 2018-05-18 DIAGNOSIS — I716 Thoracoabdominal aortic aneurysm, without rupture: Principal | ICD-10-CM

## 2018-05-18 MED ORDER — CLOPIDOGREL 75 MG TABLET
ORAL_TABLET | Freq: Every day | ORAL | 0 refills | 0 days | Status: CP
Start: 2018-05-18 — End: 2018-05-26

## 2018-05-25 ENCOUNTER — Ambulatory Visit
Admit: 2018-05-25 | Discharge: 2018-05-28 | Disposition: A | Discharge status: Home Health | Payer: MEDICARE | Source: Other Acute Inpatient Hospital | Admitting: Vascular Surgery

## 2018-05-25 DIAGNOSIS — T81718A Complication of other artery following a procedure, not elsewhere classified, initial encounter: Principal | ICD-10-CM

## 2018-05-26 DIAGNOSIS — T81718A Complication of other artery following a procedure, not elsewhere classified, initial encounter: Principal | ICD-10-CM

## 2018-05-27 DIAGNOSIS — T81718A Complication of other artery following a procedure, not elsewhere classified, initial encounter: Principal | ICD-10-CM

## 2018-05-28 DIAGNOSIS — T81718A Complication of other artery following a procedure, not elsewhere classified, initial encounter: Principal | ICD-10-CM

## 2018-06-24 ENCOUNTER — Ambulatory Visit: Admit: 2018-06-24 | Discharge: 2018-06-24 | Payer: MEDICARE

## 2018-06-24 ENCOUNTER — Ambulatory Visit: Admit: 2018-06-24 | Discharge: 2018-06-24 | Payer: MEDICARE | Attending: Vascular Surgery | Primary: Vascular Surgery

## 2018-06-24 DIAGNOSIS — I716 Thoracoabdominal aortic aneurysm, without rupture: Principal | ICD-10-CM

## 2018-07-02 ENCOUNTER — Ambulatory Visit: Admit: 2018-07-02 | Discharge: 2018-07-02 | Payer: MEDICARE

## 2018-07-02 ENCOUNTER — Ambulatory Visit
Admit: 2018-07-02 | Discharge: 2018-07-02 | Payer: MEDICARE | Attending: Gynecologic Oncology | Primary: Gynecologic Oncology

## 2018-07-02 DIAGNOSIS — C482 Malignant neoplasm of peritoneum, unspecified: Principal | ICD-10-CM

## 2018-07-02 DIAGNOSIS — C569 Malignant neoplasm of unspecified ovary: Principal | ICD-10-CM

## 2018-07-02 MED ORDER — RUCAPARIB 300 MG TABLET
ORAL_TABLET | Freq: Two times a day (BID) | ORAL | 5 refills | 0.00000 days | Status: CP
Start: 2018-07-02 — End: 2018-10-05

## 2018-07-22 NOTE — Unmapped (Signed)
Specialty Medication Follow-up    Katherine Harper is a 84 y.o. female with recurrent ovarian cancer who I am seeing for follow up on their treatment with maintenance rucaparib.     Chemotherapy: Rucaparib 600 mg PO BID  Start date: 07/23/18    A/P:   1. Oral Chemotherapy: CBC w/diff and CMP reviewed. Grade 1 anemia and grade 1 thrombocytopenia both of which are baseline. Patient meets parameters to start maintenance PARP inhibitor therapy. Will start rucaparib upon delivery which is expected 07/23/18. Will obtain labs in 3 weeks to assess for hematologic toxicity.   ?? Start rucaparib 600 mg PO BID  ?? Obtain CBC w/diff and CMP in 3 weeks    I spent approximately 5 minutes in direct patient care.    Next follow up: In 3 weeks with labs results.    Hermelinda Medicus, PharmD, BCOP, CPP  Gynecologic Oncology Clinic Pharmacist  Pager: [phone]    S/O: Ms. Ransome was contacted regarding oral chemotherapy. Administration instructions were verbalized by patient. No questions at this time.     Medications reviewed and updated in EPIC? yes    Missed doses: N/A    Labs  No visits with results within 1 Day(s) from this visit.   Latest known visit with results is:   Infusion on 07/02/2018   Component Date Value Ref Range Status   ??? CA 125 07/02/2018 113.0* 0.0 - 34.9 U/mL Final   ??? Magnesium 07/02/2018 1.6  1.6 - 2.2 mg/dL Final   ??? Sodium 13/24/4010 140  135 - 145 mmol/L Final   ??? Potassium 07/02/2018 3.8  3.5 - 5.0 mmol/L Final   ??? Chloride 07/02/2018 103  98 - 107 mmol/L Final   ??? CO2 07/02/2018 31.0* 22.0 - 30.0 mmol/L Final   ??? BUN 07/02/2018 15  7 - 21 mg/dL Final   ??? Creatinine 07/02/2018 0.81  0.60 - 1.00 mg/dL Final   ??? BUN/Creatinine Ratio 07/02/2018 19   Final   ??? EGFR CKD-EPI Non-African American,* 07/02/2018 66  >=60 mL/min/1.86m2 Final   ??? EGFR CKD-EPI African American, Fem* 07/02/2018 76  >=60 mL/min/1.44m2 Final   ??? Glucose 07/02/2018 96  65 - 179 mg/dL Final   ??? Calcium 27/25/3664 9.3  8.5 - 10.2 mg/dL Final   ??? Albumin 40/34/7425 4.1  3.5 - 5.0 g/dL Final   ??? Total Protein 07/02/2018 7.0  6.5 - 8.3 g/dL Final   ??? Total Bilirubin 07/02/2018 0.5  0.0 - 1.2 mg/dL Final   ??? AST 95/63/8756 31  14 - 38 U/L Final   ??? ALT 07/02/2018 19  15 - 48 U/L Final   ??? Alkaline Phosphatase 07/02/2018 61  38 - 126 U/L Final   ??? Anion Gap 07/02/2018 6* 9 - 15 mmol/L Final   ??? WBC 07/02/2018 6.9  4.5 - 11.0 10*9/L Final   ??? RBC 07/02/2018 3.27* 4.00 - 5.20 10*12/L Final   ??? HGB 07/02/2018 10.8* 12.0 - 16.0 g/dL Final   ??? HCT 43/32/9518 33.3* 36.0 - 46.0 % Final   ??? MCV 07/02/2018 102.0* 80.0 - 100.0 fL Final   ??? MCH 07/02/2018 33.0  26.0 - 34.0 pg Final   ??? MCHC 07/02/2018 32.3  31.0 - 37.0 g/dL Final   ??? RDW 84/16/6063 15.5* 12.0 - 15.0 % Final   ??? MPV 07/02/2018 7.5  7.0 - 10.0 fL Final   ??? Platelet 07/02/2018 134* 150 - 440 10*9/L Final   ??? Neutrophils % 07/02/2018 67.9  %  Final   ??? Lymphocytes % 07/02/2018 21.4  % Final   ??? Monocytes % 07/02/2018 7.5  % Final   ??? Eosinophils % 07/02/2018 1.1  % Final   ??? Basophils % 07/02/2018 0.5  % Final   ??? Absolute Neutrophils 07/02/2018 4.7  2.0 - 7.5 10*9/L Final   ??? Absolute Lymphocytes 07/02/2018 1.5  1.5 - 5.0 10*9/L Final   ??? Absolute Monocytes 07/02/2018 0.5  0.2 - 0.8 10*9/L Final   ??? Absolute Eosinophils 07/02/2018 0.1  0.0 - 0.4 10*9/L Final   ??? Absolute Basophils 07/02/2018 0.0  0.0 - 0.1 10*9/L Final   ??? Large Unstained Cells 07/02/2018 2  0 - 4 % Final   ??? Macrocytosis 07/02/2018 Moderate* Not Present Final   ??? Hypochromasia 07/02/2018 Moderate* Not Present Final

## 2018-08-05 MED ORDER — ONDANSETRON HCL 8 MG TABLET
ORAL_TABLET | Freq: Three times a day (TID) | ORAL | 2 refills | 0 days | Status: CP | PRN
Start: 2018-08-05 — End: 2019-01-11

## 2018-08-05 MED ORDER — PROCHLORPERAZINE MALEATE 10 MG TABLET
ORAL_TABLET | Freq: Four times a day (QID) | ORAL | 2 refills | 0.00000 days | Status: CP | PRN
Start: 2018-08-05 — End: ?

## 2018-08-10 NOTE — Unmapped (Signed)
Incoming call from Katherine Harper stating she is continuing to have nausea and constant GI upset (pain & indigestion) since starting Rucaparib 300 mg BID. She states Zofran and Compazine have not helped the nausea, she has not taken it scheduled. She also takes Protonix 40 mg BID daily. She is having regular bowel movements and is eating although she is constantly very uncomfortable. She also c/o LE swelling that does not resolve overnight. (ankles and feet). Patient denies pain/redness or warmth.     Patient advised to start wearing compression stockings daily starting before she gets out of bed in the morning and until she goes to bed at night. Patient instructed to take zofran and compazine alternating scheduled and I will touch base with our pharmacist about other options for GI upset secondary to Rucaparib therapy.

## 2018-08-11 NOTE — Unmapped (Signed)
Thank you for choosing [facility] Gynecologic Oncology. If you have any questions or concerns, please do not hesitate to contact us.      Business Hours:  Administration: [phone]  Clinic: [phone]  Fax: [phone]    After hours and on weekends:  [phone]; ask for the resident on call for Gyn Oncology

## 2018-08-11 NOTE — Unmapped (Signed)
Gynecologic Oncology Return Clinic Visit    August 11, 2018 4:20 PM      Reason for Visit: GI upset, LE edema    Treatment History:  Oncology History    01/04/2016  Paclitaxel/Carbo        Adenocarcinoma (CMS-HCC)    11/06/2015 Initial Diagnosis     Adenocarcinoma (RAF-HCC)      12/03/2015 Surgery     Robotic-assisted bilateral salpingo-oophorectomy with biopsy of peripancreatic mass and lysis of adhesions greater than 45 minutes      01/04/2016 - 06/18/2016 Chemotherapy     Paclitaxel/carboplatin x 5 cycles. Last cycle omitted due to prolonged thrombocytopenia      06/19/2017 No evidence of disease     CT showed no evidence of recurrent disease.   CA 125 92.6. Re check at next visit         10/08/2017 -  Other     CA 31.3        Adenocarcinoma determined by biopsy of liver (CMS-HCC)    12/03/2015 Initial Diagnosis     Adenocarcinoma determined by biopsy of liver (CMS-HCC)      02/08/2018 -  Chemotherapy     Chemotherapy Treatment    Treatment Goal Palliative   Line of Treatment [No plan line of treatment]   Plan Name OP OVARIAN PACLITAXEL/CARBOPLATIN   Start Date 02/09/2018   End Date 07/01/2018 (Planned)   Provider Maurie Boettcher, MD   Chemotherapy dexamethasone (DECADRON) tablet 12 mg, 12 mg, Oral, Once, 3 of 6 cycles  Administration: 12 mg (02/09/2018), 12 mg (03/18/2018), 12 mg (04/29/2018)  CARBOplatin (PARAPLATIN) 414.5 mg in sodium chloride (NS) 0.9 % 250 mL IVPB, 414.5 mg (100 % of original dose 414.5 mg), Intravenous, Once, 3 of 6 cycles  Dose modification:   (original dose 414.5 mg, Cycle 1)  Administration: 414.5 mg (02/09/2018), 403 mg (03/18/2018), 372.5 mg (04/29/2018)  PACLItaxel (TAXOL) 220.5 mg in sodium chloride 0.9% NON-PVC (NS) 0.9 % 500 mL IVPB, 131.25 mg/m2 = 220.5 mg (75 % of original dose 175 mg/m2), Intravenous, Once, 2 of 2 cycles  Dose modification: 131.25 mg/m2 (original dose 175 mg/m2, Cycle 1, Reason: Dose Not Tolerated, Comment: Dose reduction of 25%)  Administration: 220.5 mg (02/09/2018) Primary peritoneal adenocarcinoma (CMS-HCC)    01/03/2016 Initial Diagnosis     Robotic-assisted bilateral salpingo-oophorectomy with biopsy of peripancreatic mass and lysis of adhesions. IIIC primary peritoneal cancer      01/04/2016 - 06/18/2016 Chemotherapy     Paclitaxel/Carboplatin x 5. Several treatment delays related to thrombocytopenia, eliminated C6      07/11/2016 Remission     PET: no active disease. Referral to hematology for prolonged thrombocytopenia. Bone bx recommended, however, procedure is being post-poned per family request.      11/25/2016 No evidence of disease     Normal gyn exam. Ca 125 24.8        02/24/2017 No evidence of disease     Normal exam. Ca 125, 65.7. Plan for CT at next visit as a follow up to rising Ca 125      10/23/2017 No evidence of disease     No clinical evidence of disease on exam. 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  -- Cystic lesion in the  right thyroid lobe is unchanged in size, without appreciable increased FDG uptake relative to background thyroid. Further characterization with thyroid ultrasound is recommended if not already performed.             Malignant neoplasm of ovary, unspecified laterality (CMS-HCC)    02/08/2018 -  Chemotherapy     Chemotherapy Treatment    Treatment Goal Palliative   Line of Treatment [No plan line of treatment]   Plan Name OP OVARIAN PACLITAXEL/CARBOPLATIN   Start Date 02/09/2018   End Date 07/01/2018 (Planned)   Provider Maurie Boettcher, MD   Chemotherapy dexamethasone (DECADRON) tablet 12 mg, 12 mg, Oral, Once, 3 of 6 cycles  Administration: 12 mg (02/09/2018), 12 mg (03/18/2018), 12 mg (04/29/2018)  CARBOplatin (PARAPLATIN) 414.5 mg in sodium chloride (NS) 0.9 % 250 mL IVPB, 414.5 mg (100 % of original dose 414.5 mg), Intravenous, Once, 3 of 6 cycles  Dose modification:   (original dose 414.5 mg, Cycle 1)  Administration: 414.5 mg (02/09/2018), 403 mg (03/18/2018), 372.5 mg (04/29/2018)  PACLItaxel (TAXOL) 220.5 mg in sodium chloride 0.9% NON-PVC (NS) 0.9 % 500 mL IVPB, 131.25 mg/m2 = 220.5 mg (75 % of original dose 175 mg/m2), Intravenous, Once, 2 of 2 cycles  Dose modification: 131.25 mg/m2 (original dose 175 mg/m2, Cycle 1, Reason: Dose Not Tolerated, Comment: Dose reduction of 25%)  Administration: 220.5 mg (02/09/2018)         03/02/2018 Initial Diagnosis     Malignant neoplasm of ovary, unspecified laterality (CMS-HCC)         Interval History:  Pt presents for evaluation of GI upset related to PARPi as well as LE edema.  Pt reports abdominal discomfort and indigestion, reports nausea with one day of vomiting last week.  Several episodes of emesis that day, but none since.  She has been taking Zofran PRN since Friday, states this has helped some.  Zantac was added yesterday when patient called however she denies starting this yet.  She already takes Protonix 40mg  BID.  She is overall tolerating PO, but does note a decreased appetite.  She states she feels hungry but no food ever sounds good to her.     In addition, patient reports bilateral LE edema.  She has not tried compression stockings.  She states her legs look about the same as usualt but she has noticed a 9 pound weight gain over the past week or so.  She also reports abdominal fullness.  She denies cough, SOB, palpitations, or other associated sx.     Otherwise, she reports some fatigue and noticing that she doesn't feel as steady on her feet as she did before starting the PARPi.        Past Medical/Surgical History:  Past Medical History:   Diagnosis Date   ??? CHF (congestive heart failure) (CMS-HCC) 1996    history of due to medication   ??? Coronary artery disease    ??? Esophageal stricture     caused by esophageal ulcers   ??? Hyperlipemia    ??? Hypertension    ??? Hypoglycemia ??? Hypothyroid     synthroid   ??? Melanoma (CMS-HCC)     removed   ??? Thoracic aneurysm without mention of rupture     ascending   ??? 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 ABDOMINAL HYSTERECTOMY      ?  BSO   ??? APPENDECTOMY     ??? BUNIONECTOMY     ??? CARDIAC CATHETERIZATION     ??? CATARACT EXTRACTION, BILATERAL     ??? CHOLECYSTECTOMY     ??? LUMBAR SPINE SURGERY     ??? PANCREATIC CYST EXCISION      Pt states that a mass from the pancreas was removed.   ??? PR ENDOVASC TAA REINCL SUBCL N/A 08/09/2014    Procedure: TEVAR;  Surgeon: Suszanne Finch, MD;  Location: MAIN OR [facility];  Service: Vascular   ??? PR EXPLORATORY OF ABDOMEN N/A 12/03/2015    Procedure: Robotic Xi Exploratory Laparotomy, Exploratory Celiotomy With Or Without Biopsy(S);  Surgeon: Maurie Boettcher, MD;  Location: MAIN OR [facility];  Service: Gynecology Oncology   ??? PR INSERT ENDOVASC PROSTH, TAA N/A 08/09/2014    Procedure: PLACE PROXIMAL EXTENSION PROSTHESIS ENDOVASCULAR REPAIR DESCENDING THORACIC AORTA; INITIAL EXTENSION;  Surgeon: Suszanne Finch, MD;  Location: MAIN OR [facility];  Service: Vascular   ??? PR LAP, SURG ENTEROLYSIS N/A 12/03/2015    Procedure: Laparoscopy, Surgical, Enterolysis (Freeing Of Intestinal Adhesion) (Separate Procedure);  Surgeon: Maurie Boettcher, MD;  Location: MAIN OR [facility];  Service: Gynecology Oncology   ??? PR LAP,RMV  ADNEXAL STRUCTURE Bilateral 12/03/2015    Procedure: ROBOTIC XI LAPAROSCOPY, SURGICAL; W/REMOVAL OF ADNEXAL STRUCTURES (PARTIAL OR TOTAL OOPHORECTOMY &/OR SALPINGECTOMY);  Surgeon: Maurie Boettcher, MD;  Location: MAIN OR [facility];  Service: Gynecology Oncology   ??? PR UPGI ENDOSCOPY W/US FN BX N/A 10/19/2015    Procedure: UGI W/ TRANSENDOSCOPIC ULTRASOUND GUIDED INTRAMURAL/TRANSMURAL FINE NEEDLE ASPIRATION/BIOPSY(S), ESOPHAGUS;  Surgeon: Vonda Antigua, MD;  Location: GI [facility];  Service: Gastroenterology   ??? PR VISCER AND INFRARENAL ABDOM AORTA 4+ PROSTHESIS N/A 10/03/2014    Procedure: FEVAR;  Surgeon: Suszanne Finch, MD;  Location: MAIN OR [place];  Service: Vascular   ??? ROTATOR CUFF REPAIR Right    ??? SKIN CANCER EXCISION      melanoma   ??? STOMACH SURGERY  1980    benign growth       Family History   Problem Relation Age of Onset   ??? Stroke Mother    ??? Cancer Father    ??? Cancer Sister    ??? Heart disease Sister    ??? Hypertension Sister    ??? COPD Brother    ??? Aneurysm Neg Hx    ??? Ovarian cancer Neg Hx    ??? Colon cancer Neg Hx    ??? Endometrial cancer Neg Hx        Social History     Socioeconomic History   ??? Marital status: Widowed     Spouse name: Not on file   ??? Number of children: Not on file   ??? Years of education: Not on file   ??? Highest education level: Not on file   Occupational History   ??? Not on file   Social Needs   ??? Financial resource strain: Not on file   ??? Food insecurity:     Worry: Not on file     Inability: Not on file   ??? Transportation needs:     Medical: Not on file     Non-medical: Not on file   Tobacco Use   ??? Smoking status: Former Smoker     Packs/day: 1.00     Years: 10.00     Pack years: 10.00     Last attempt to quit: 03/22/1983  Years since quitting: 35.4   ??? Smokeless tobacco: Never Used   Substance and Sexual Activity   ??? Alcohol use: No   ??? Drug use: No   ??? Sexual activity: Not on file   Lifestyle   ??? Physical activity:     Days per week: Not on file     Minutes per session: Not on file   ??? Stress: Not on file   Relationships   ??? Social connections:     Talks on phone: Not on file     Gets together: Not on file     Attends religious service: Not on file     Active member of club or organization: Not on file     Attends meetings of clubs or organizations: Not on file     Relationship status: Not on file   Other Topics Concern   ??? Not on file   Social History Narrative    Retired as a Transport planner.  Currently takes care of family members, does errands for sister in Nursing home. Does exercise bike 10 miles a day.  3 out of 4 Daughters are nearby.        Current Medications:    Current Outpatient Medications:   ???  amLODIPine (NORVASC) 10 MG tablet, Take 5 mg by mouth daily. Take half tablet of 10 mg (5 mg) by mouth daily., Disp: , Rfl:   ???  atorvastatin (LIPITOR) 40 MG tablet, Take 40 mg by mouth daily., Disp: , Rfl: 0  ???  brimonidine (ALPHAGAN) 0.2 % ophthalmic solution, Administer 1 drop to both eyes Two (2) times a day. , Disp: , Rfl: 2  ???  carvedilol (COREG) 25 MG tablet, Take 25 mg by mouth Two (2) times a day., Disp: , Rfl: 0  ???  dorzolamide (TRUSOPT) 2 % ophthalmic solution, Administer 2 drops to both eyes Two (2) times a day., Disp: , Rfl:   ???  furosemide (LASIX) 40 MG tablet, Take 40 mg by mouth daily., Disp: , Rfl:   ???  IRON, FERROUS SULFATE, ORAL, Take 325 mg by mouth daily. , Disp: , Rfl:   ???  latanoprost (XALATAN) 0.005 % ophthalmic solution, Administer 1 drop to both eyes nightly. , Disp: , Rfl:   ???  levothyroxine (SYNTHROID, LEVOTHROID) 25 MCG tablet, Take 25 mcg by mouth daily., Disp: , Rfl:   ???  LORazepam (ATIVAN) 0.5 MG tablet, Take 0.5 mg by mouth nightly as needed for anxiety (at bedtime if needed for anxiety or sleep)., Disp: , Rfl:   ???  losartan (COZAAR) 50 MG tablet, Take 100 mg by mouth daily. , Disp: , Rfl:   ???  MULTIVITAMIN W-MINERALS/LUTEIN (CENTRUM SILVER ORAL), Take 1 tablet by mouth daily with breakfast. , Disp: , Rfl:   ???  ondansetron (ZOFRAN) 8 MG tablet, Take 1 tablet (8 mg total) by mouth every eight (8) hours as needed for nausea., Disp: 30 tablet, Rfl: 2  ???  pantoprazole (PROTONIX) 40 MG tablet, Take 40 mg by mouth Two (2) times a day., Disp: , Rfl:   ???  polyethylene glycol (MIRALAX) 17 gram packet, Take 17 g by mouth Three (3) times a day., Disp: , Rfl:   ???  potassium chloride SA (K-DUR,KLOR-CON) 20 MEQ tablet, Take 20 mEq by mouth daily., Disp: , Rfl:   ???  prochlorperazine (COMPAZINE) 10 MG tablet, Take 1 tablet (10 mg total) by mouth every six (6) hours as needed for nausea. for up to 365  doses, Disp: 30 tablet, Rfl: 2  ???  rucaparib 300 mg Tab, Take 2 tablets (600mg ) by mouth Two (2) times a day., Disp: 120 tablet, Rfl: 5  ???  timolol (BETIMOL) 0.25 % ophthalmic solution, 2 drops Two (2) times a day., Disp: , Rfl:     Review of Symptoms:  Complete 10-system review is negative except as above in Interval History.    Physical Exam:  There were no vitals taken for this visit.  General: Alert, oriented, no acute distress.  HEENT: Posterior oropharynx clear, sclera anicteric.  Chest: Clear to auscultation bilaterally.    Cardiovascular: Regular rate and rhythm, no murmurs.  Abdomen: Soft, nontender.  Normoactive bowel sounds.  No masses or hepatosplenomegaly appreciated.    Extremities: Grossly normal range of motion.  Warm, well perfused.  2+ pitting edema to the level of th mid-shin.  No asymmetry, erythema, palpable cords, warmth.   Skin: No rashes or lesions noted.  Lymphatics: No cervical, supraclavicular, or inguinal adenopathy.  GU: Deferred    Laboratory & Radiologic Studies:  None pertinent    Assessment & Plan:  Katherine Harper is a 84 y.o. woman with advanced ovarian cancer s/p therapies as above currently on Rucaparib who presents for the evaluation of the following complaints.     1. LE edema:  Clinical suspicion low for DVT based on exam but will order PVLs to r/o.  BSUS showed now ascites.  Will order CXR to r/o pulmonary edema/effusion (although cardiac/lung exam unremarkable) in the setting of known CHF.  Encouraged close interval follow up with cardiologist in case medication adjustments are needed.     2. GI upset: Improving with Zofran.  We discussed scheduling Zofran rather than taking it PRN.  Encouraged compliance with Zantac up to BID for dyspepsia.      3. Fatigue/weakness/instability:  Has worsened since starting Rucaparib.  Per review of Up-to-date, these sx can occur in up to 40% of patients on this therapy.  Will check CBC and CMP to rule out anemia and electrolyte abnormalities that may be contributing.     Dr. Stanford Breed was available.     Kimmy Parish A. Romeo Apple, MD  Clinical Fellow, Division of Gynecologic Oncology  Department of Obstetrics and Gynecology  [place] [facility]

## 2018-08-12 ENCOUNTER — Ambulatory Visit
Admit: 2018-08-12 | Discharge: 2018-08-13 | Payer: MEDICARE | Attending: Gynecologic Oncology | Primary: Gynecologic Oncology

## 2018-08-12 ENCOUNTER — Ambulatory Visit
Admit: 2018-08-12 | Discharge: 2018-08-13 | Payer: MEDICARE | Attending: Nurse Practitioner | Primary: Nurse Practitioner

## 2018-08-12 ENCOUNTER — Ambulatory Visit: Admit: 2018-08-12 | Discharge: 2018-08-13 | Payer: MEDICARE

## 2018-08-12 DIAGNOSIS — C482 Malignant neoplasm of peritoneum, unspecified: Secondary | ICD-10-CM

## 2018-08-12 DIAGNOSIS — C569 Malignant neoplasm of unspecified ovary: Principal | ICD-10-CM

## 2018-08-12 LAB — COMPREHENSIVE METABOLIC PANEL
ALBUMIN: 3.7 g/dL (ref 3.5–5.0)
ALKALINE PHOSPHATASE: 81 U/L (ref 38–126)
ALT (SGPT): 99 U/L — ABNORMAL HIGH (ref 15–48)
ANION GAP: 6 mmol/L — ABNORMAL LOW (ref 7–15)
AST (SGOT): 81 U/L — ABNORMAL HIGH (ref 14–38)
BILIRUBIN TOTAL: 0.8 mg/dL (ref 0.0–1.2)
BLOOD UREA NITROGEN: 14 mg/dL (ref 7–21)
BUN / CREAT RATIO: 12
CALCIUM: 8.9 mg/dL (ref 8.5–10.2)
CO2: 28 mmol/L (ref 22.0–30.0)
CREATININE: 1.15 mg/dL — ABNORMAL HIGH (ref 0.60–1.00)
EGFR CKD-EPI AA FEMALE: 49 mL/min/{1.73_m2} — ABNORMAL LOW (ref >=60)
GLUCOSE RANDOM: 111 mg/dL (ref 65–179)
POTASSIUM: 4.1 mmol/L (ref 3.5–5.0)
PROTEIN TOTAL: 6.4 g/dL — ABNORMAL LOW (ref 6.5–8.3)
SODIUM: 134 mmol/L — ABNORMAL LOW (ref 135–145)

## 2018-08-12 LAB — CBC W/ AUTO DIFF
BASOPHILS ABSOLUTE COUNT: 0 10*9/L (ref 0.0–0.1)
BASOPHILS RELATIVE PERCENT: 0.5 %
EOSINOPHILS RELATIVE PERCENT: 1.2 %
HEMATOCRIT: 28.9 % — ABNORMAL LOW (ref 36.0–46.0)
HEMOGLOBIN: 9.5 g/dL — ABNORMAL LOW (ref 12.0–16.0)
LARGE UNSTAINED CELLS: 2 % (ref 0–4)
LYMPHOCYTES ABSOLUTE COUNT: 1.2 10*9/L — ABNORMAL LOW (ref 1.5–5.0)
LYMPHOCYTES RELATIVE PERCENT: 22 %
MEAN CORPUSCULAR HEMOGLOBIN CONC: 32.9 g/dL (ref 31.0–37.0)
MEAN CORPUSCULAR HEMOGLOBIN: 32.3 pg (ref 26.0–34.0)
MEAN PLATELET VOLUME: 8 fL (ref 7.0–10.0)
MONOCYTES ABSOLUTE COUNT: 0.4 10*9/L (ref 0.2–0.8)
MONOCYTES RELATIVE PERCENT: 7.4 %
NEUTROPHILS ABSOLUTE COUNT: 3.6 10*9/L (ref 2.0–7.5)
NEUTROPHILS RELATIVE PERCENT: 66.7 %
PLATELET COUNT: 81 10*9/L — ABNORMAL LOW (ref 150–440)
RED BLOOD CELL COUNT: 2.95 10*12/L — ABNORMAL LOW (ref 4.00–5.20)
RED CELL DISTRIBUTION WIDTH: 15.1 % — ABNORMAL HIGH (ref 12.0–15.0)
WBC ADJUSTED: 5.5 10*9/L (ref 4.5–11.0)

## 2018-08-12 LAB — HEMATOCRIT: Lab: 28.9 — ABNORMAL LOW

## 2018-08-12 LAB — ALBUMIN: Albumin:MCnc:Pt:Ser/Plas:Qn:: 3.7

## 2018-08-12 NOTE — Unmapped (Signed)
Reviewed results of workup.     Lab Results   Component Value Date    WBC 5.5 08/12/2018    HGB 9.5 (L) 08/12/2018    HCT 28.9 (L) 08/12/2018    PLT 81 (L) 08/12/2018       Lab Results   Component Value Date    NA 134 (L) 08/12/2018    K 4.1 08/12/2018    CL 100 08/12/2018    CO2 28.0 08/12/2018    BUN 14 08/12/2018    CREATININE 1.15 (H) 08/12/2018    GLU 111 08/12/2018    CALCIUM 8.9 08/12/2018    MG 1.6 07/02/2018    PHOS 4.0 05/26/2018       Lab Results   Component Value Date    BILITOT 0.8 08/12/2018    BILIDIR <0.10 05/11/2018    PROT 6.4 (L) 08/12/2018    ALBUMIN 3.7 08/12/2018    ALT 99 (H) 08/12/2018    AST 81 (H) 08/12/2018    ALKPHOS 81 08/12/2018       Lab Results   Component Value Date    LABPROT 12.9 (H) 10/05/2014    INR 1.16 05/25/2018    APTT 32.3 05/25/2018     CXR  FINDINGS:    Unchanged right chest Port-A-Cath with tip in the distal SVC. Stable aortic endograft.    Radiographically clear lungs.    No pleural effusion or pneumothorax.    Unremarkable cardiomediastinal silhouette.       Impression       Clear lungs.      PVLs  Final Interpretation  Right  There is no evidence of DVT in the lower extremity. However, portions of this examination were limited- see technologist comments above. There is no evidence of obstruction proximal to the inguinal ligament or in the common femoral vein.  Left  There is no evidence of DVT in the lower extremity. However, portions of this examination were limited- see technologist comments above. There is no evidence of obstruction proximal to the inguinal ligament or in the common femoral vein.    Above workup notable for elevated Cr and AST/ALT.  CXR and PVLs normal.     Left message advising patient of this.  Will message Dr. Ruthe Mannan regarding f/u plans and any additional workup, although believe above is attributable to Rucaparib    Dionna Wiedemann A. Romeo Apple, MD  Clinical Fellow, Division of Gynecologic Oncology  Department of Obstetrics and Gynecology  [place] [facility]

## 2018-08-18 NOTE — Unmapped (Signed)
Specialty Medication Follow-up    Katherine Harper is a 84 y.o. female with recurrent ovarian cancer who I am seeing for follow up on their treatment with maintenance rucaparib.     Chemotherapy: Rucaparib 600 mg PO BID  Start date: 07/23/18    A/P:   1. Oral Chemotherapy: CBC w/diff and CMP reviewed. Grade 2 anemia, grade 1 thrombocytopenia, grade 1 serum creatinine elevation, grade 1 AST elevation, grade 1 ALT elevation, grade 2 nausea, grade 1 vomiting, and grade 1 fatigue all of which are new or worsened since starting rucaparib therapy. Given the several new onset toxicity and platelets less than 100,000/mm3 will HOLD rucaparib for 1 week and repeat labs. If platelets recover to above 100000/mm3 will restart rucaparib at 300 QAM and 600 QPM.   ?? HOLD rucaparib 600 mg PO BID  ?? Obtain CBC w/diff in 1 week    I spent approximately 5 minutes in direct patient care.    Next follow up: In 1 week with labs results.    Hermelinda Medicus, PharmD, BCOP, CPP  Gynecologic Oncology Clinic Pharmacist  Pager: [phone]    S/O: [name] was contacted regarding recent lab results. She reports that she feels as if she has been run over by a truck. She reports nausea, vomiting, lower extremity edema, and fatigue. While the nausea and vomiting has improved she still endorses decreased energy overall.     Medications reviewed and updated in EPIC? no    Missed doses: -----    Labs  No visits with results within 1 Day(s) from this visit.   Latest known visit with results is:   Infusion on 08/12/2018   Component Date Value Ref Range Status   ??? Sodium 08/12/2018 134* 135 - 145 mmol/L Final   ??? Potassium 08/12/2018 4.1  3.5 - 5.0 mmol/L Final   ??? Chloride 08/12/2018 100  98 - 107 mmol/L Final   ??? CO2 08/12/2018 28.0  22.0 - 30.0 mmol/L Final   ??? BUN 08/12/2018 14  7 - 21 mg/dL Final   ??? Creatinine 08/12/2018 1.15* 0.60 - 1.00 mg/dL Final   ??? BUN/Creatinine Ratio 08/12/2018 12   Final   ??? EGFR CKD-EPI Non-African American,* 08/12/2018 43* >=60 mL/min/1.64m2 Final   ??? EGFR CKD-EPI African American, Fem* 08/12/2018 49* >=60 mL/min/1.70m2 Final   ??? Glucose 08/12/2018 111  65 - 179 mg/dL Final   ??? Calcium 21/30/8657 8.9  8.5 - 10.2 mg/dL Final   ??? Albumin 84/69/6295 3.7  3.5 - 5.0 g/dL Final   ??? Total Protein 08/12/2018 6.4* 6.5 - 8.3 g/dL Final   ??? Total Bilirubin 08/12/2018 0.8  0.0 - 1.2 mg/dL Final   ??? AST 28/41/3244 81* 14 - 38 U/L Final   ??? ALT 08/12/2018 99* 15 - 48 U/L Final   ??? Alkaline Phosphatase 08/12/2018 81  38 - 126 U/L Final   ??? Anion Gap 08/12/2018 6* 7 - 15 mmol/L Final   ??? WBC 08/12/2018 5.5  4.5 - 11.0 10*9/L Final   ??? RBC 08/12/2018 2.95* 4.00 - 5.20 10*12/L Final   ??? HGB 08/12/2018 9.5* 12.0 - 16.0 g/dL Final   ??? HCT 11/12/7251 28.9* 36.0 - 46.0 % Final   ??? MCV 08/12/2018 98.0  80.0 - 100.0 fL Final   ??? MCH 08/12/2018 32.3  26.0 - 34.0 pg Final   ??? MCHC 08/12/2018 32.9  31.0 - 37.0 g/dL Final   ??? RDW 66/44/0347 15.1* 12.0 - 15.0 % Final   ???  MPV 08/12/2018 8.0  7.0 - 10.0 fL Final   ??? Platelet 08/12/2018 81* 150 - 440 10*9/L Final   ??? Neutrophils % 08/12/2018 66.7  % Final   ??? Lymphocytes % 08/12/2018 22.0  % Final   ??? Monocytes % 08/12/2018 7.4  % Final   ??? Eosinophils % 08/12/2018 1.2  % Final   ??? Basophils % 08/12/2018 0.5  % Final   ??? Absolute Neutrophils 08/12/2018 3.6  2.0 - 7.5 10*9/L Final   ??? Absolute Lymphocytes 08/12/2018 1.2* 1.5 - 5.0 10*9/L Final   ??? Absolute Monocytes 08/12/2018 0.4  0.2 - 0.8 10*9/L Final   ??? Absolute Eosinophils 08/12/2018 0.1  0.0 - 0.4 10*9/L Final   ??? Absolute Basophils 08/12/2018 0.0  0.0 - 0.1 10*9/L Final   ??? Large Unstained Cells 08/12/2018 2  0 - 4 % Final   ??? Macrocytosis 08/12/2018 Slight* Not Present Final

## 2018-08-24 LAB — COMPREHENSIVE METABOLIC PANEL
A/G RATIO: 2 (ref 1.2–2.2)
ALBUMIN: 4.3 g/dL (ref 3.5–4.7)
ALKALINE PHOSPHATASE: 89 IU/L (ref 39–117)
ALT (SGPT): 37 IU/L — ABNORMAL HIGH (ref 0–32)
AST (SGOT): 38 IU/L (ref 0–40)
BILIRUBIN TOTAL: 0.5 mg/dL (ref 0.0–1.2)
BLOOD UREA NITROGEN: 19 mg/dL (ref 8–27)
BUN / CREAT RATIO: 20 (ref 12–28)
CALCIUM: 9.3 mg/dL (ref 8.7–10.3)
CHLORIDE: 102 mmol/L (ref 96–106)
CO2: 27 mmol/L (ref 20–29)
CREATININE: 0.97 mg/dL (ref 0.57–1.00)
GFR MDRD AF AMER: 61 mL/min/{1.73_m2}
GLOBULIN, TOTAL: 2.2 g/dL (ref 1.5–4.5)
POTASSIUM: 4.1 mmol/L (ref 3.5–5.2)
SODIUM: 143 mmol/L (ref 134–144)
TOTAL PROTEIN: 6.5 g/dL (ref 6.0–8.5)

## 2018-08-24 LAB — CBC W/ DIFFERENTIAL
BASOPHILS ABSOLUTE COUNT: 0.1 10*3/uL (ref 0.0–0.2)
BASOPHILS RELATIVE PERCENT: 1 %
EOSINOPHILS ABSOLUTE COUNT: 0.2 10*3/uL (ref 0.0–0.4)
EOSINOPHILS RELATIVE PERCENT: 4 %
HEMATOCRIT: 28.4 % — ABNORMAL LOW (ref 34.0–46.6)
HEMOGLOBIN: 9.7 g/dL — ABNORMAL LOW (ref 11.1–15.9)
IMMATURE GRANULOCYTES: 0 %
LYMPHOCYTES ABSOLUTE COUNT: 1.8 10*3/uL (ref 0.7–3.1)
LYMPHOCYTES RELATIVE PERCENT: 33 %
MEAN CORPUSCULAR HEMOGLOBIN CONC: 34.2 g/dL (ref 31.5–35.7)
MEAN CORPUSCULAR HEMOGLOBIN: 32 pg (ref 26.6–33.0)
MEAN CORPUSCULAR VOLUME: 94 fL (ref 79–97)
MONOCYTES ABSOLUTE COUNT: 0.6 10*3/uL (ref 0.1–0.9)
NEUTROPHILS ABSOLUTE COUNT: 2.8 10*3/uL (ref 1.4–7.0)
NEUTROPHILS RELATIVE PERCENT: 52 %
PLATELET COUNT: 97 10*3/uL — CL (ref 150–450)
RED BLOOD CELL COUNT: 3.03 x10E6/uL — ABNORMAL LOW (ref 3.77–5.28)
RED CELL DISTRIBUTION WIDTH: 14.9 % (ref 12.3–15.4)
WHITE BLOOD CELL COUNT: 5.5 10*3/uL (ref 3.4–10.8)

## 2018-08-24 LAB — CALCIUM: Lab: 9.3

## 2018-08-24 LAB — BANDED NEUTROPHILS ABSOLUTE COUNT: Lab: 0

## 2018-08-25 NOTE — Unmapped (Signed)
Specialty Medication Follow-up    Katherine Harper is a 84 y.o. female with recurrent ovarian cancer who I am seeing for follow up on their treatment with maintenance rucaparib.     Chemotherapy: Rucaparib 600 mg PO BID  Start date: 07/23/18    A/P:   1. Oral Chemotherapy: CBC w/diff and CMP reviewed. Grade 1 thrombocytopenia and grade 1 ALT elevation both of which have improved. Grade 2 anemia which is stable. Patient's platelets do not yet meet threshold to restart oral rucaparib therapy, continue to hold rucaparib. Once platelets are above 100000 cells/mm3 will restart at reduced dose of 300 mg QAM and 600 mg QPM. Will repeat labs in 1 week to reassess treatment parameters.   ?? HOLD rucaparib 600 mg PO BID  ?? Obtain CBC w/diff in 1 week    I spent approximately 5 minutes in direct patient care.    Next follow up: In 1 week with labs results.    Hermelinda Medicus, PharmD, BCOP, CPP  Gynecologic Oncology Clinic Pharmacist  Pager: [phone]    S/O: Katherine Harper was contacted regarding recent lab results. Katherine Harper reports no new symptoms since holding rucaparib therapy.    Medications reviewed and updated in EPIC? no    Missed doses: -----    Labs  No visits with results within 1 Day(s) from this visit.   Latest known visit with results is:   Ancillary Orders on 08/23/2018   Component Date Value Ref Range Status   ??? WBC 08/23/2018 5.5  3.4 - 10.8 x10E3/uL Final   ??? RBC 08/23/2018 3.03* 3.77 - 5.28 x10E6/uL Final   ??? HGB 08/23/2018 9.7* 11.1 - 15.9 g/dL Final   ??? HCT 45/40/9811 28.4* 34.0 - 46.6 % Final   ??? MCV 08/23/2018 94  79 - 97 fL Final   ??? MCH 08/23/2018 32.0  26.6 - 33.0 pg Final   ??? MCHC 08/23/2018 34.2  31.5 - 35.7 g/dL Final   ??? RDW 91/47/8295 14.9  12.3 - 15.4 % Final   ??? Platelet 08/23/2018 97* 150 - 450 x10E3/uL Final    Comment: Actual platelet count may be somewhat higher than reported due to  aggregation of platelets in this sample.     ??? Neutrophils % 08/23/2018 52  Not Estab. % Final   ??? Lymphocytes % 08/23/2018 33  Not Estab. % Final   ??? Monocytes % 08/23/2018 10  Not Estab. % Final   ??? Eosinophils % 08/23/2018 4  Not Estab. % Final   ??? Basophils % 08/23/2018 1  Not Estab. % Final   ??? Absolute Neutrophils 08/23/2018 2.8  1.4 - 7.0 x10E3/uL Final   ??? Absolute Lymphocytes 08/23/2018 1.8  0.7 - 3.1 x10E3/uL Final   ??? Absolute Monocytes  08/23/2018 0.6  0.1 - 0.9 x10E3/uL Final   ??? Absolute Eosinophils 08/23/2018 0.2  0.0 - 0.4 x10E3/uL Final   ??? Absolute Basophils  08/23/2018 0.1  0.0 - 0.2 x10E3/uL Final   ??? Immature Granulocytes 08/23/2018 0  Not Estab. % Final   ??? Bands Absolute 08/23/2018 0.0  0.0 - 0.1 x10E3/uL Final   ??? Hematology Comments: 08/23/2018 Note:   Final    Verified by microscopic examination.   ??? Glucose 08/23/2018 102* 65 - 99 mg/dL Final   ??? BUN 62/13/0865 19  8 - 27 mg/dL Final   ??? Creatinine 08/23/2018 0.97  0.57 - 1.00 mg/dL Final   ??? GFR MDRD Non Af Amer 08/23/2018 53* >59 mL/min/1.73 Final   ???  GFR MDRD Af Amer 08/23/2018 61  >59 mL/min/1.73 Final   ??? BUN/Creatinine Ratio 08/23/2018 20  12 - 28 Final   ??? Sodium 08/23/2018 143  134 - 144 mmol/L Final   ??? Potassium 08/23/2018 4.1  3.5 - 5.2 mmol/L Final   ??? Chloride 08/23/2018 102  96 - 106 mmol/L Final   ??? CO2 08/23/2018 27  20 - 29 mmol/L Final   ??? Calcium 08/23/2018 9.3  8.7 - 10.3 mg/dL Final   ??? Total Protein 08/23/2018 6.5  6.0 - 8.5 g/dL Final   ??? Albumin 86/57/8469 4.3  3.5 - 4.7 g/dL Final   ??? Globulin, Total 08/23/2018 2.2  1.5 - 4.5 g/dL Final   ??? A/G Ratio 62/95/2841 2.0  1.2 - 2.2 Final   ??? Total Bilirubin 08/23/2018 0.5  0.0 - 1.2 mg/dL Final   ??? Alkaline Phosphatase 08/23/2018 89  39 - 117 IU/L Final   ??? AST 08/23/2018 38  0 - 40 IU/L Final   ??? ALT 08/23/2018 37* 0 - 32 IU/L Final

## 2018-08-31 LAB — CBC W/ DIFFERENTIAL
BANDED NEUTROPHILS ABSOLUTE COUNT: 0 10*3/uL (ref 0.0–0.1)
BASOPHILS ABSOLUTE COUNT: 0.1 10*3/uL (ref 0.0–0.2)
EOSINOPHILS ABSOLUTE COUNT: 0.1 10*3/uL (ref 0.0–0.4)
EOSINOPHILS RELATIVE PERCENT: 1 %
HEMATOCRIT: 29.9 % — ABNORMAL LOW (ref 34.0–46.6)
HEMOGLOBIN: 9.6 g/dL — ABNORMAL LOW (ref 11.1–15.9)
IMMATURE GRANULOCYTES: 0 %
LYMPHOCYTES ABSOLUTE COUNT: 1.6 10*3/uL (ref 0.7–3.1)
LYMPHOCYTES RELATIVE PERCENT: 31 %
MEAN CORPUSCULAR HEMOGLOBIN CONC: 32.1 g/dL (ref 31.5–35.7)
MEAN CORPUSCULAR HEMOGLOBIN: 31.6 pg (ref 26.6–33.0)
MEAN CORPUSCULAR VOLUME: 98 fL — ABNORMAL HIGH (ref 79–97)
MONOCYTES ABSOLUTE COUNT: 0.5 10*3/uL (ref 0.1–0.9)
MONOCYTES RELATIVE PERCENT: 10 %
NEUTROPHILS ABSOLUTE COUNT: 2.9 10*3/uL (ref 1.4–7.0)
NEUTROPHILS RELATIVE PERCENT: 57 %
RED CELL DISTRIBUTION WIDTH: 15.2 % (ref 12.3–15.4)

## 2018-08-31 LAB — RED BLOOD CELL COUNT: Lab: 3.04 — ABNORMAL LOW

## 2018-08-31 LAB — COMPREHENSIVE METABOLIC PANEL
A/G RATIO: 1.8 (ref 1.2–2.2)
ALBUMIN: 4.1 g/dL (ref 3.5–4.7)
ALT (SGPT): 21 IU/L (ref 0–32)
AST (SGOT): 26 IU/L (ref 0–40)
BILIRUBIN TOTAL: 0.4 mg/dL (ref 0.0–1.2)
BUN / CREAT RATIO: 15 (ref 12–28)
CHLORIDE: 106 mmol/L (ref 96–106)
CO2: 26 mmol/L (ref 20–29)
CREATININE: 0.87 mg/dL (ref 0.57–1.00)
GFR MDRD AF AMER: 69 mL/min/{1.73_m2}
GFR MDRD NON AF AMER: 60 mL/min/{1.73_m2}
GLOBULIN, TOTAL: 2.3 g/dL (ref 1.5–4.5)
GLUCOSE: 93 mg/dL (ref 65–99)
POTASSIUM: 4 mmol/L (ref 3.5–5.2)
SODIUM: 145 mmol/L — ABNORMAL HIGH (ref 134–144)
TOTAL PROTEIN: 6.4 g/dL (ref 6.0–8.5)

## 2018-08-31 LAB — GFR MDRD NON AF AMER: Lab: 60

## 2018-09-02 NOTE — Unmapped (Signed)
Specialty Medication Follow-up    Katherine Harper is a 84 y.o. female with recurrent ovarian cancer who I am seeing for follow up on their treatment with maintenance rucaparib.     Chemotherapy: Rucaparib 600 mg PO BID  Start date: 07/23/18    A/P:   1. Oral Chemotherapy: CBC w/diff and CMP reviewed. Grade 2 anemia and grade 1 thrombocytopenia both of which are stable. Given that labs have stabilized will restart rucaparib at a reduced dose of 300 mg in the morning and 600 mg at bedtime. Given the history of hematologic toxicity will repeat labs in 1 week.   ?? Restart rucaparib 300 mg QAM and 600 mg QPM  ?? Obtain CBC w/diff in 1 week    I spent approximately 5 minutes in direct patient care.    Next follow up: In 1 week with labs results.    Hermelinda Medicus, PharmD, BCOP, CPP  Gynecologic Oncology Clinic Pharmacist  Pager: [phone]    S/O: Ms. Weniger daughter was contacted regarding recent lab results.     Medications reviewed and updated in EPIC? no    Missed doses: N/A    Labs (08/30/18)  WBC 5.1 x10*9/L   Hgb 9.6 g/dL   Plt 95 A54*0/J   ANC 2.9 x10*9/L   SCr 0.87 mg/dL   TBili 0.4 mg/dL   AST 26 U/L   ALT 21 U/L   Alk Phos 77 U/L

## 2018-09-09 NOTE — Unmapped (Signed)
Specialty Medication Follow-up    Katherine Harper is a 84 y.o. female with recurrent ovarian cancer who I am seeing for follow up on their treatment with maintenance rucaparib.     Chemotherapy: Rucaparib 600 mg PO BID  Start date: 07/23/18    A/P:   1. Oral Chemotherapy: CBC w/diff reviewed. Grade 1 thrombocytopenia which is stable. Grade 1 anemia which is improved. Grade 1 nausea and grade 2 fatigue both of which are new. Given the impact on quality of life that fatigue is currently having will consider dose reduction vs switching PARPi. Patient wishes to try for 2 weeks before switching. In the meantime will discuss next possible steps for treatment with primary oncologist. No grade 3 toxicities therefore continue at current dose intensity. Given previous hematologic toxicities will repeat labs in 1 week.   ?? Continue rucaparib 300 mg QAM and 600 mg QPM  ?? Obtain CBC w/diff in 1 week    I spent approximately 10 minutes in direct patient care.    Next follow up: In 1 week with labs results.    Hermelinda Medicus, PharmD, BCOP, CPP  Gynecologic Oncology Clinic Pharmacist  Pager: [phone]    S/O: Katherine Harper daughter was contacted regarding recent lab results. She reports that Katherine Harper starting on Sunday started to feel unwell. She has mild nausea which she is able to manage with ginger. She denies any episodes of vomiting. She endorses fatigue which is new for her and a generalized lack of energy to get up and get dressed in the morning. She wishes to continue for at least 2 weeks to see if this is something as her body gets use to the medication improves.     Medications reviewed and updated in EPIC? no    Missed doses: ----    Labs  No visits with results within 1 Day(s) from this visit.   Latest known visit with results is:   Ancillary Orders on 09/06/2018   Component Date Value Ref Range Status   ??? WBC 09/06/2018 7.6  3.4 - 10.8 x10E3/uL Final   ??? RBC 09/06/2018 3.07* 3.77 - 5.28 x10E6/uL Final   ??? HGB 09/06/2018 10.2* 11.1 - 15.9 g/dL Final   ??? HCT 45/40/9811 29.9* 34.0 - 46.6 % Final   ??? MCV 09/06/2018 97  79 - 97 fL Final   ??? MCH 09/06/2018 33.2* 26.6 - 33.0 pg Final   ??? MCHC 09/06/2018 34.1  31.5 - 35.7 g/dL Final   ??? RDW 91/47/8295 14.9  12.3 - 15.4 % Final   ??? Platelet 09/06/2018 90* 150 - 450 x10E3/uL Final   ??? Neutrophils % 09/06/2018 69  Not Estab. % Final   ??? Lymphocytes % 09/06/2018 20  Not Estab. % Final   ??? Monocytes % 09/06/2018 9  Not Estab. % Final   ??? Eosinophils % 09/06/2018 1  Not Estab. % Final   ??? Basophils % 09/06/2018 1  Not Estab. % Final   ??? Absolute Neutrophils 09/06/2018 5.2  1.4 - 7.0 x10E3/uL Final   ??? Absolute Lymphocytes 09/06/2018 1.5  0.7 - 3.1 x10E3/uL Final   ??? Absolute Monocytes  09/06/2018 0.7  0.1 - 0.9 x10E3/uL Final   ??? Absolute Eosinophils 09/06/2018 0.1  0.0 - 0.4 x10E3/uL Final   ??? Absolute Basophils  09/06/2018 0.1  0.0 - 0.2 x10E3/uL Final   ??? Immature Granulocytes 09/06/2018 0  Not Estab. % Final   ??? Bands Absolute 09/06/2018 0.0  0.0 - 0.1 x10E3/uL  Final   ??? Hematology Comments: 09/06/2018 Note:   Final    Verified by microscopic examination.

## 2018-09-13 LAB — HEMATOLOGY COMMENTS:

## 2018-09-13 LAB — CBC W/ DIFFERENTIAL
BASOPHILS ABSOLUTE COUNT: 0.1 10*3/uL (ref 0.0–0.2)
BASOPHILS RELATIVE PERCENT: 1 %
EOSINOPHILS ABSOLUTE COUNT: 0.1 10*3/uL (ref 0.0–0.4)
EOSINOPHILS RELATIVE PERCENT: 1 %
HEMATOCRIT: 29.9 % — ABNORMAL LOW (ref 34.0–46.6)
IMMATURE GRANULOCYTES: 0 %
LYMPHOCYTES ABSOLUTE COUNT: 1.5 10*3/uL (ref 0.7–3.1)
LYMPHOCYTES RELATIVE PERCENT: 20 %
MEAN CORPUSCULAR HEMOGLOBIN CONC: 34.1 g/dL (ref 31.5–35.7)
MEAN CORPUSCULAR HEMOGLOBIN: 33.2 pg — ABNORMAL HIGH (ref 26.6–33.0)
MEAN CORPUSCULAR VOLUME: 97 fL (ref 79–97)
MONOCYTES ABSOLUTE COUNT: 0.7 10*3/uL (ref 0.1–0.9)
MONOCYTES RELATIVE PERCENT: 9 %
NEUTROPHILS ABSOLUTE COUNT: 5.2 10*3/uL (ref 1.4–7.0)
NEUTROPHILS RELATIVE PERCENT: 69 %
PLATELET COUNT: 90 10*3/uL — CL (ref 150–450)
RED BLOOD CELL COUNT: 3.07 x10E6/uL — ABNORMAL LOW (ref 3.77–5.28)
RED CELL DISTRIBUTION WIDTH: 14.9 % (ref 12.3–15.4)
WHITE BLOOD CELL COUNT: 7.6 10*3/uL (ref 3.4–10.8)

## 2018-09-14 LAB — COMPREHENSIVE METABOLIC PANEL
A/G RATIO: 1.8 (ref 1.2–2.2)
ALKALINE PHOSPHATASE: 96 IU/L (ref 39–117)
ALT (SGPT): 46 IU/L — ABNORMAL HIGH (ref 0–32)
AST (SGOT): 54 IU/L — ABNORMAL HIGH (ref 0–40)
BILIRUBIN TOTAL: 0.7 mg/dL (ref 0.0–1.2)
BLOOD UREA NITROGEN: 13 mg/dL (ref 8–27)
BUN / CREAT RATIO: 12 (ref 12–28)
CALCIUM: 9.2 mg/dL (ref 8.7–10.3)
CHLORIDE: 104 mmol/L (ref 96–106)
CREATININE: 1.1 mg/dL — ABNORMAL HIGH (ref 0.57–1.00)
GFR MDRD AF AMER: 52 mL/min/{1.73_m2} — ABNORMAL LOW
GFR MDRD NON AF AMER: 45 mL/min/{1.73_m2} — ABNORMAL LOW
GLOBULIN, TOTAL: 2.3 g/dL (ref 1.5–4.5)
GLUCOSE: 93 mg/dL (ref 65–99)
POTASSIUM: 3.9 mmol/L (ref 3.5–5.2)
SODIUM: 143 mmol/L (ref 134–144)
TOTAL PROTEIN: 6.5 g/dL (ref 6.0–8.5)

## 2018-09-14 LAB — CBC W/ DIFFERENTIAL
BANDED NEUTROPHILS ABSOLUTE COUNT: 0 10*3/uL (ref 0.0–0.1)
BASOPHILS ABSOLUTE COUNT: 0.1 10*3/uL (ref 0.0–0.2)
BASOPHILS RELATIVE PERCENT: 1 %
EOSINOPHILS ABSOLUTE COUNT: 0.3 10*3/uL (ref 0.0–0.4)
EOSINOPHILS RELATIVE PERCENT: 4 %
HEMOGLOBIN: 9.8 g/dL — ABNORMAL LOW (ref 11.1–15.9)
LYMPHOCYTES ABSOLUTE COUNT: 1.7 10*3/uL (ref 0.7–3.1)
LYMPHOCYTES RELATIVE PERCENT: 24 %
MEAN CORPUSCULAR HEMOGLOBIN CONC: 33.4 g/dL (ref 31.5–35.7)
MEAN CORPUSCULAR HEMOGLOBIN: 32.6 pg (ref 26.6–33.0)
MEAN CORPUSCULAR VOLUME: 97 fL (ref 79–97)
MONOCYTES ABSOLUTE COUNT: 0.5 10*3/uL (ref 0.1–0.9)
MONOCYTES RELATIVE PERCENT: 7 %
NEUTROPHILS ABSOLUTE COUNT: 4.5 10*3/uL (ref 1.4–7.0)
NEUTROPHILS RELATIVE PERCENT: 64 %
RED BLOOD CELL COUNT: 3.01 x10E6/uL — ABNORMAL LOW (ref 3.77–5.28)
RED CELL DISTRIBUTION WIDTH: 15 % (ref 12.3–15.4)
WHITE BLOOD CELL COUNT: 7.1 10*3/uL (ref 3.4–10.8)

## 2018-09-14 LAB — BASOPHILS ABSOLUTE COUNT: Lab: 0.1

## 2018-09-14 LAB — GFR MDRD AF AMER: Lab: 52 — ABNORMAL LOW

## 2018-09-16 NOTE — Unmapped (Signed)
Specialty Medication Follow-up    Katherine Harper is a 84 y.o. female with recurrent ovarian cancer who I am seeing for follow up on their treatment with maintenance rucaparib.     Chemotherapy: Rucaparib 600 mg PO BID  Start date: 07/23/18    A/P:   1. Oral Chemotherapy: CBC w/diff and CMP reviewed. Grade 2 anemia, grade 1 thrombocytopenia, grade 1 serum creatinine elevation, grade 1 AST elevation, and grade 1 ALT elevation all of which have worsened. Given that platelets have fallen to [address] will hold rucaparib therapy. Repeat labs in 1 week to reassess hematologic toxicity. Will discuss with primary oncologist plan moving forward continue with rucaparib at a further dose reduction or switch to a different PARP inhibitor.   ?? HOLD rucaparib 300 mg QAM and 600 mg QPM  ?? Obtain CBC w/diff in 1 week    I spent approximately 10 minutes in direct patient care.    Next follow up: In 1 week with labs results.    Hermelinda Medicus, PharmD, BCOP, CPP  Gynecologic Oncology Clinic Pharmacist  Pager: [phone]    S/O: Katherine Harper daughter was contacted regarding recent lab results. She reports that she continues to feel unwell since restarting rucaparib therapy.     Medications reviewed and updated in EPIC? no    Missed doses: ----    Labs (09/13/18)  WBC 7.1 x10*9/L   Hgb 9.8 g/dL   Plt 76 Y86*5/H   ANC 4.5 x10*9/L   SCr 1.1 mg/dL   TBili 0.7 mg/dL   AST 54 U/L   ALT 46 U/L   Alk Phos 96 U/L

## 2018-09-21 LAB — COMPREHENSIVE METABOLIC PANEL
A/G RATIO: 2 (ref 1.2–2.2)
ALBUMIN: 4.1 g/dL (ref 3.5–4.7)
ALKALINE PHOSPHATASE: 79 IU/L (ref 39–117)
ALT (SGPT): 26 IU/L (ref 0–32)
AST (SGOT): 31 IU/L (ref 0–40)
BILIRUBIN TOTAL: 0.5 mg/dL (ref 0.0–1.2)
BLOOD UREA NITROGEN: 17 mg/dL (ref 8–27)
BUN / CREAT RATIO: 17 (ref 12–28)
CALCIUM: 9.1 mg/dL (ref 8.7–10.3)
CHLORIDE: 105 mmol/L (ref 96–106)
CO2: 26 mmol/L (ref 20–29)
CREATININE: 1.01 mg/dL — ABNORMAL HIGH (ref 0.57–1.00)
GFR MDRD AF AMER: 58 mL/min/{1.73_m2} — ABNORMAL LOW
GFR MDRD NON AF AMER: 50 mL/min/{1.73_m2} — ABNORMAL LOW
GLOBULIN, TOTAL: 2.1 g/dL (ref 1.5–4.5)
POTASSIUM: 3.9 mmol/L (ref 3.5–5.2)
SODIUM: 144 mmol/L (ref 134–144)

## 2018-09-21 LAB — CBC W/ DIFFERENTIAL
BANDED NEUTROPHILS ABSOLUTE COUNT: 0 10*3/uL (ref 0.0–0.1)
BASOPHILS ABSOLUTE COUNT: 0.1 10*3/uL (ref 0.0–0.2)
BASOPHILS RELATIVE PERCENT: 1 %
EOSINOPHILS ABSOLUTE COUNT: 0.1 10*3/uL (ref 0.0–0.4)
EOSINOPHILS RELATIVE PERCENT: 2 %
HEMATOCRIT: 27.7 % — ABNORMAL LOW (ref 34.0–46.6)
HEMOGLOBIN: 9.5 g/dL — ABNORMAL LOW (ref 11.1–15.9)
IMMATURE GRANULOCYTES: 0 %
LYMPHOCYTES ABSOLUTE COUNT: 1.6 10*3/uL (ref 0.7–3.1)
LYMPHOCYTES RELATIVE PERCENT: 38 %
MEAN CORPUSCULAR HEMOGLOBIN: 33.1 pg — ABNORMAL HIGH (ref 26.6–33.0)
MEAN CORPUSCULAR VOLUME: 97 fL (ref 79–97)
MONOCYTES ABSOLUTE COUNT: 0.4 10*3/uL (ref 0.1–0.9)
MONOCYTES RELATIVE PERCENT: 10 %
NEUTROPHILS RELATIVE PERCENT: 49 %
PLATELET COUNT: 60 10*3/uL — CL (ref 150–450)
RED CELL DISTRIBUTION WIDTH: 15.7 % — ABNORMAL HIGH (ref 12.3–15.4)
WHITE BLOOD CELL COUNT: 4.3 10*3/uL (ref 3.4–10.8)

## 2018-09-21 LAB — MEAN CORPUSCULAR VOLUME: Lab: 97

## 2018-09-21 LAB — ALT (SGPT): Lab: 26

## 2018-09-24 NOTE — Unmapped (Signed)
Specialty Medication Follow-up    Katherine Harper is a 84 y.o. female with recurrent ovarian cancer who I am seeing for follow up on their treatment with maintenance rucaparib.     Chemotherapy: Rucaparib 600 mg PO BID  Start date: 07/23/18    A/P:   1. Oral Chemotherapy: CBC w/diff and CMP reviewed. Grade 2 anemia and grade 2 thrombocytopenia both of which have worsened. Grade 1 serum creatinine elevation which has improved. Platelets do not yet meet threshold to restart rucaparib. Will continue to hold and repeat in 1 week.    ?? HOLD rucaparib 300 mg QAM and 600 mg QPM  ?? Obtain CBC w/diff in 1 week    I spent approximately 5 minutes in direct patient care.    Next follow up: In 1 week with labs results.    Hermelinda Medicus, PharmD, BCOP, CPP  Gynecologic Oncology Clinic Pharmacist  Pager: [phone]    S/O: Katherine Harper daughter was contacted regarding recent lab results. The medication has been held for the past week.    Medications reviewed and updated in EPIC? no    Missed doses: ----    Labs (09/20/18)  WBC 4.3 x10*9/L   Hgb 9.5 g/dL   Plt 60 A54*0/J   ANC 2.1 x10*9/L   SCr 1.01 mg/dL   TBili 0.5 mg/dL   AST 31 U/L   ALT 26 U/L   Alk Phos 79 U/L

## 2018-09-28 LAB — CBC W/ DIFFERENTIAL
BANDED NEUTROPHILS ABSOLUTE COUNT: 0 10*3/uL (ref 0.0–0.1)
BASOPHILS ABSOLUTE COUNT: 0.1 10*3/uL (ref 0.0–0.2)
BASOPHILS RELATIVE PERCENT: 1 %
EOSINOPHILS RELATIVE PERCENT: 3 %
HEMATOCRIT: 29.2 % — ABNORMAL LOW (ref 34.0–46.6)
HEMOGLOBIN: 9.9 g/dL — ABNORMAL LOW (ref 11.1–15.9)
LYMPHOCYTES ABSOLUTE COUNT: 1.7 10*3/uL (ref 0.7–3.1)
LYMPHOCYTES RELATIVE PERCENT: 37 %
MEAN CORPUSCULAR HEMOGLOBIN CONC: 33.9 g/dL (ref 31.5–35.7)
MEAN CORPUSCULAR HEMOGLOBIN: 33.1 pg — ABNORMAL HIGH (ref 26.6–33.0)
MEAN CORPUSCULAR VOLUME: 98 fL — ABNORMAL HIGH (ref 79–97)
MONOCYTES ABSOLUTE COUNT: 0.5 10*3/uL (ref 0.1–0.9)
MONOCYTES RELATIVE PERCENT: 11 %
NEUTROPHILS ABSOLUTE COUNT: 2.3 10*3/uL (ref 1.4–7.0)
NEUTROPHILS RELATIVE PERCENT: 48 %
PLATELET COUNT: 91 10*3/uL — CL (ref 150–450)
RED CELL DISTRIBUTION WIDTH: 15.7 % — ABNORMAL HIGH (ref 12.3–15.4)
WHITE BLOOD CELL COUNT: 4.8 10*3/uL (ref 3.4–10.8)

## 2018-09-28 LAB — BASOPHILS RELATIVE PERCENT: Lab: 1

## 2018-10-01 ENCOUNTER — Ambulatory Visit: Admit: 2018-10-01 | Discharge: 2018-10-01 | Payer: MEDICARE

## 2018-10-01 ENCOUNTER — Ambulatory Visit
Admit: 2018-10-01 | Discharge: 2018-10-01 | Payer: MEDICARE | Attending: Gynecologic Oncology | Primary: Gynecologic Oncology

## 2018-10-01 ENCOUNTER — Ambulatory Visit
Admit: 2018-10-01 | Discharge: 2018-10-01 | Payer: MEDICARE | Attending: Nurse Practitioner | Primary: Nurse Practitioner

## 2018-10-01 DIAGNOSIS — I716 Thoracoabdominal aortic aneurysm, without rupture: Principal | ICD-10-CM

## 2018-10-01 DIAGNOSIS — C482 Malignant neoplasm of peritoneum, unspecified: Principal | ICD-10-CM

## 2018-10-01 LAB — CBC W/ AUTO DIFF
BASOPHILS ABSOLUTE COUNT: 0 10*9/L (ref 0.0–0.1)
BASOPHILS RELATIVE PERCENT: 0.5 %
EOSINOPHILS ABSOLUTE COUNT: 0.1 10*9/L (ref 0.0–0.4)
EOSINOPHILS RELATIVE PERCENT: 1.7 %
HEMATOCRIT: 30.1 % — ABNORMAL LOW (ref 36.0–46.0)
HEMOGLOBIN: 9.9 g/dL — ABNORMAL LOW (ref 12.0–16.0)
LARGE UNSTAINED CELLS: 2 % (ref 0–4)
LYMPHOCYTES ABSOLUTE COUNT: 1.2 10*9/L — ABNORMAL LOW (ref 1.5–5.0)
MEAN CORPUSCULAR HEMOGLOBIN CONC: 33 g/dL (ref 31.0–37.0)
MEAN CORPUSCULAR HEMOGLOBIN: 33.3 pg (ref 26.0–34.0)
MEAN PLATELET VOLUME: 7.8 fL (ref 7.0–10.0)
MONOCYTES RELATIVE PERCENT: 7.7 %
NEUTROPHILS ABSOLUTE COUNT: 2.8 10*9/L (ref 2.0–7.5)
NEUTROPHILS RELATIVE PERCENT: 61.7 %
PLATELET COUNT: 95 10*9/L — ABNORMAL LOW (ref 150–440)
RED BLOOD CELL COUNT: 2.98 10*12/L — ABNORMAL LOW (ref 4.00–5.20)
RED CELL DISTRIBUTION WIDTH: 16.7 % — ABNORMAL HIGH (ref 12.0–15.0)
WBC ADJUSTED: 4.5 10*9/L (ref 4.5–11.0)

## 2018-10-01 LAB — COMPREHENSIVE METABOLIC PANEL
ALBUMIN: 4 g/dL (ref 3.5–5.0)
ALKALINE PHOSPHATASE: 61 U/L (ref 38–126)
ALT (SGPT): 18 U/L (ref <35)
ANION GAP: 10 mmol/L (ref 7–15)
AST (SGOT): 32 U/L (ref 14–38)
BILIRUBIN TOTAL: 0.7 mg/dL (ref 0.0–1.2)
BLOOD UREA NITROGEN: 18 mg/dL (ref 7–21)
BUN / CREAT RATIO: 18
CALCIUM: 9.3 mg/dL (ref 8.5–10.2)
CHLORIDE: 102 mmol/L (ref 98–107)
CO2: 30 mmol/L (ref 22.0–30.0)
CREATININE: 1 mg/dL (ref 0.60–1.00)
EGFR CKD-EPI AA FEMALE: 59 mL/min/{1.73_m2} — ABNORMAL LOW (ref >=60)
EGFR CKD-EPI NON-AA FEMALE: 51 mL/min/{1.73_m2} — ABNORMAL LOW (ref >=60)
GLUCOSE RANDOM: 107 mg/dL (ref 65–179)
PROTEIN TOTAL: 6.8 g/dL (ref 6.5–8.3)
SODIUM: 142 mmol/L (ref 135–145)

## 2018-10-01 LAB — MEAN CORPUSCULAR HEMOGLOBIN: Lab: 33.3

## 2018-10-01 LAB — AST (SGOT): Aspartate aminotransferase:CCnc:Pt:Ser/Plas:Qn:: 32

## 2018-10-01 LAB — CA 125: Cancer Ag 125:ACnc:Pt:Ser/Plas:Qn:: 84 — ABNORMAL HIGH

## 2018-10-01 LAB — SMEAR REVIEW

## 2018-10-01 NOTE — Unmapped (Signed)
Followup Visit Note      Patient Name: Katherine Harper  Patient Age: 84 y.o.  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  She had a lesion on her pancreas that was unresectable.  She responded completely to Taxol carboplatin.  She then had recurrence noted on Ca1 25 and imaging.  We did Taxol carboplatin again and if switched to Rucaparib as maintenance.  She has had difficulty with platelet counts and we have dose reduced her to 300 twice a day.  She reports no major complaints today our plan is to check her labs and Ca1 25.  We are going to see how she tolerates this dose and may reduce her to 200 mg twice a day.  We also could switch PARP inhibitors if necessary or even go to weekly Taxol.  Our goal is disease control and quality of life.  We reviewed this with her and her daughter and all of their questions were answered.  I will be seeing her in 4 weeks to see how well she is tolerating this dose.    Reason for visit  Primary peritoneal cancer    Patient is here to discuss ongoing plans for treatment.      I have independently reviewed the patient's Medical Records     Interval History:    Oncology History    01/04/2016  Paclitaxel/Carbo        Adenocarcinoma (CMS-HCC)    11/06/2015 Initial Diagnosis     Adenocarcinoma (RAF-HCC)      12/03/2015 Surgery     Robotic-assisted bilateral salpingo-oophorectomy with biopsy of peripancreatic mass and lysis of adhesions greater than 45 minutes      01/04/2016 - 06/18/2016 Chemotherapy     Paclitaxel/carboplatin x 5 cycles. Last cycle omitted due to prolonged thrombocytopenia      06/19/2017 No evidence of disease     CT showed no evidence of recurrent disease.   CA 125 92.6. Re check at next visit         10/08/2017 -  Other     CA 31.3        Adenocarcinoma determined by biopsy of liver (CMS-HCC)    12/03/2015 Initial Diagnosis     Adenocarcinoma determined by biopsy of liver (CMS-HCC)      02/08/2018 -  Chemotherapy     Chemotherapy Treatment    Treatment Goal Palliative   Line of Treatment [No plan line of treatment]   Plan Name OP OVARIAN PACLITAXEL/CARBOPLATIN   Start Date 02/09/2018   End Date 07/01/2018 (Planned)   Provider Maurie Boettcher, MD   Chemotherapy dexamethasone (DECADRON) tablet 12 mg, 12 mg, Oral, Once, 3 of 6 cycles  Administration: 12 mg (02/09/2018), 12 mg (03/18/2018), 12 mg (04/29/2018)  CARBOplatin (PARAPLATIN) 414.5 mg in sodium chloride (NS) 0.9 % 250 mL IVPB, 414.5 mg (100 % of original dose 414.5 mg), Intravenous, Once, 3 of 6 cycles  Dose modification:   (original dose 414.5 mg, Cycle 1)  Administration: 414.5 mg (02/09/2018), 403 mg (03/18/2018), 372.5 mg (04/29/2018)  PACLItaxel (TAXOL) 220.5 mg in sodium chloride 0.9% NON-PVC (NS) 0.9 % 500 mL IVPB, 131.25 mg/m2 = 220.5 mg (75 % of original dose 175 mg/m2), Intravenous, Once, 2 of 2 cycles  Dose modification: 131.25 mg/m2 (original dose 175 mg/m2, Cycle 1, Reason: Dose Not Tolerated, Comment: Dose reduction of 25%)  Administration: 220.5 mg (02/09/2018)  Primary peritoneal adenocarcinoma (CMS-HCC)    01/03/2016 Initial Diagnosis     Robotic-assisted bilateral salpingo-oophorectomy with biopsy of peripancreatic mass and lysis of adhesions. IIIC primary peritoneal cancer      01/04/2016 - 06/18/2016 Chemotherapy     Paclitaxel/Carboplatin x 5. Several treatment delays related to thrombocytopenia, eliminated C6      07/11/2016 Remission     PET: no active disease. Referral to hematology for prolonged thrombocytopenia. Bone bx recommended, however, procedure is being post-poned per family request.      11/25/2016 No evidence of disease     Normal gyn exam. Ca 125 24.8        02/24/2017 No evidence of disease     Normal exam. Ca 125, 65.7. Plan for CT at next visit as a follow up to rising Ca 125      10/23/2017 No evidence of disease     No clinical evidence of disease on exam. 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  -- Cystic lesion in the right thyroid lobe is unchanged in size, without appreciable increased FDG uptake relative to background thyroid. Further characterization with thyroid ultrasound is recommended if not already performed.             Malignant neoplasm of ovary, unspecified laterality (CMS-HCC)    02/08/2018 -  Chemotherapy     Chemotherapy Treatment    Treatment Goal Palliative   Line of Treatment [No plan line of treatment]   Plan Name OP OVARIAN PACLITAXEL/CARBOPLATIN   Start Date 02/09/2018   End Date 07/01/2018 (Planned)   Provider Maurie Boettcher, MD   Chemotherapy dexamethasone (DECADRON) tablet 12 mg, 12 mg, Oral, Once, 3 of 6 cycles  Administration: 12 mg (02/09/2018), 12 mg (03/18/2018), 12 mg (04/29/2018)  CARBOplatin (PARAPLATIN) 414.5 mg in sodium chloride (NS) 0.9 % 250 mL IVPB, 414.5 mg (100 % of original dose 414.5 mg), Intravenous, Once, 3 of 6 cycles  Dose modification:   (original dose 414.5 mg, Cycle 1)  Administration: 414.5 mg (02/09/2018), 403 mg (03/18/2018), 372.5 mg (04/29/2018)  PACLItaxel (TAXOL) 220.5 mg in sodium chloride 0.9% NON-PVC (NS) 0.9 % 500 mL IVPB, 131.25 mg/m2 = 220.5 mg (75 % of original dose 175 mg/m2), Intravenous, Once, 2 of 2 cycles  Dose modification: 131.25 mg/m2 (original dose 175 mg/m2, Cycle 1, Reason: Dose Not Tolerated, Comment: Dose reduction of 25%)  Administration: 220.5 mg (02/09/2018)         03/02/2018 Initial Diagnosis     Malignant neoplasm of ovary, unspecified laterality (CMS-HCC)         Past Medical History:   Diagnosis Date   ??? CHF (congestive heart failure) (CMS-HCC) 1996    history of due to medication   ??? Coronary artery disease    ??? Esophageal stricture     caused by esophageal ulcers   ??? Hyperlipemia    ??? Hypertension    ??? Hypoglycemia    ??? Hypothyroid     synthroid   ??? Melanoma (CMS-HCC)     removed   ??? Thoracic aneurysm without mention of rupture     ascending   ??? 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 ABDOMINAL HYSTERECTOMY      ?BSO   ??? APPENDECTOMY     ??? BUNIONECTOMY     ??? CARDIAC CATHETERIZATION     ??? CATARACT EXTRACTION, BILATERAL     ??? CHOLECYSTECTOMY     ??? LUMBAR SPINE SURGERY     ??? PANCREATIC CYST EXCISION      Pt states that a mass from the pancreas was removed.   ??? PR ENDOVASC TAA REINCL SUBCL N/A 08/09/2014    Procedure: TEVAR;  Surgeon: Suszanne Finch, MD;  Location: MAIN OR [facility];  Service: Vascular   ??? PR EXPLORATORY OF ABDOMEN N/A 12/03/2015    Procedure: Robotic Xi Exploratory Laparotomy, Exploratory Celiotomy With Or Without Biopsy(S);  Surgeon: Maurie Boettcher, MD;  Location: MAIN OR [facility];  Service: Gynecology Oncology   ??? PR INSERT ENDOVASC PROSTH, TAA N/A 08/09/2014    Procedure: PLACE PROXIMAL EXTENSION PROSTHESIS ENDOVASCULAR REPAIR DESCENDING THORACIC AORTA; INITIAL EXTENSION;  Surgeon: Suszanne Finch, MD;  Location: MAIN OR [facility];  Service: Vascular   ??? PR LAP, SURG ENTEROLYSIS N/A 12/03/2015    Procedure: Laparoscopy, Surgical, Enterolysis (Freeing Of Intestinal Adhesion) (Separate Procedure);  Surgeon: Maurie Boettcher, MD;  Location: MAIN OR [facility];  Service: Gynecology Oncology   ??? PR LAP,RMV  ADNEXAL STRUCTURE Bilateral 12/03/2015    Procedure: ROBOTIC XI LAPAROSCOPY, SURGICAL; W/REMOVAL OF ADNEXAL STRUCTURES (PARTIAL OR TOTAL OOPHORECTOMY &/OR SALPINGECTOMY);  Surgeon: Maurie Boettcher, MD;  Location: MAIN OR [facility];  Service: Gynecology Oncology   ??? PR UPGI ENDOSCOPY W/US FN BX N/A 10/19/2015    Procedure: UGI W/ TRANSENDOSCOPIC ULTRASOUND GUIDED INTRAMURAL/TRANSMURAL FINE NEEDLE ASPIRATION/BIOPSY(S), ESOPHAGUS;  Surgeon: Vonda Antigua, MD;  Location: [facility];  Service: Gastroenterology   ??? PR VISCER AND INFRARENAL ABDOM AORTA 4+ PROSTHESIS N/A 10/03/2014    Procedure: FEVAR;  Surgeon: Suszanne Finch, MD;  Location: MAIN OR [place];  Service: Vascular   ??? ROTATOR CUFF REPAIR Right    ??? SKIN CANCER EXCISION      melanoma   ??? STOMACH SURGERY  1980    benign growth        Family History   Problem Relation Age of Onset   ??? Stroke Mother    ??? Cancer Father    ??? Cancer Sister    ??? Heart disease Sister    ??? Hypertension Sister    ??? COPD Brother    ??? Aneurysm Neg Hx    ??? Ovarian cancer Neg Hx    ??? Colon cancer Neg Hx    ??? Endometrial cancer Neg Hx       Family Status   Relation Name Status   ??? Mother  (Not Specified)   ??? Father  (Not Specified)   ??? Sister  (Not Specified)   ??? Brother  (Not Specified)   ??? Neg Hx  (Not Specified)     Social History     Occupational History   ??? Not on file   Tobacco Use   ??? Smoking status: Former Smoker     Packs/day: 1.00     Years: 10.00     Pack years: 10.00     Last attempt to quit: 03/22/1983     Years since quitting: 35.5   ??? Smokeless tobacco: Never Used   Substance and Sexual Activity   ??? Alcohol use: No   ??? Drug use: No   ??? Sexual activity: Not on file  The following portions of the patient's history were reviewed and updated as appropriate: allergies, current medications, past family history, past medical history, past social history, past surgical history and problem list.    Review of Systems   Constitutional: Positive for fatigue. Negative for appetite change and unexpected weight change.   Gastrointestinal: Negative for abdominal distention, abdominal pain, blood in stool, constipation, diarrhea, nausea, rectal pain and vomiting.   Genitourinary: Negative for bladder incontinence, difficulty urinating, hematuria, nocturia, pelvic pain, vaginal bleeding and vaginal discharge.        Vital signs for this encounter:  BSA: 1.68 meters squared  BP 219/91  - Pulse 73  - Temp 36.3 ??C (97.3 ??F) (Oral)  - Wt 64.3 kg (141 lb 12.8 oz)  - SpO2 96%  - BMI 25.93 kg/m??     Physical Exam      Labs:    WBC   Date Value Ref Range Status   09/27/2018 4.8 3.4 - 10.8 x10E3/uL Final   01/30/2016 4.4  Final     HGB   Date Value Ref Range Status   09/27/2018 9.9 (L) 11.1 - 15.9 g/dL Final     Hgb, blood gas   Date Value Ref Range Status   10/03/2014 10.2 (L) 12.0 - 16.0 g/dL Final     HCT   Date Value Ref Range Status   09/27/2018 29.2 (L) 34.0 - 46.6 % Final     Platelet   Date Value Ref Range Status   09/27/2018 91 (LL) 150 - 450 x10E3/uL Final     Comment:     Actual platelet count may be somewhat higher than reported due to  aggregation of platelets in this sample.       Magnesium   Date Value Ref Range Status   07/02/2018 1.6 1.6 - 2.2 mg/dL Final   29/56/2130 1.7 mg/dL Final     Creatinine Whole Blood, POC   Date Value Ref Range Status   10/08/2017 0.8 0.7 - 1.1 mg/dL Final   86/57/8469 0.8 0.7 - 1.1 mg/dL Final     Comment:     Peformed by: [facility]  [address]  [place], [place] [address]       Creatinine, Serum   Date Value Ref Range Status   06/02/2016 0.71  Final     Creatinine   Date Value Ref Range Status   09/20/2018 1.01 (H) 0.57 - 1.00 mg/dL Final     AST   Date Value Ref Range Status   09/20/2018 31 0 - 40 IU/L Final     CA 125   Date Value Ref Range Status   07/02/2018 113.0 (H) 0.0 - 34.9 U/mL Final   04/29/2018 63.4 (H) 0.0 - 34.9 U/mL Final   03/18/2018 72.7 (H) 0.0 - 34.9 U/mL Final   01/29/2018 160.0 (H) 0.0 - 34.9 U/mL Final   10/23/2017 39.4 (H) 0.0 - 34.9 U/mL Final   10/08/2017 31.3 0.0 - 34.9 U/mL Final   06/19/2017 92.6 (H) 0.0 - 34.9 U/mL Final   02/24/2017 65.7 (H) 0.0 - 34.9 U/mL Final   11/25/2016 24.8 0.0 - 34.9 U/mL Final   10/23/2016 16.4 0.0 - 34.9 U/mL Final   07/11/2016 15.9 0.0 - 34.9 U/mL Final   06/19/2016 15.4 0.0 - 34.9 U/mL Final   05/06/2016 15.9 0.0 - 34.9 U/mL Final   04/17/2016 18.9 0.0 - 34.9 U/mL Final   02/08/2016 21.6 0.0 - 34.9 U/mL Final   01/01/2016  99.2 (H) 0.0 - 34.9 U/mL Final   11/06/2015 102.0 (H) 0.0 - 34.9 U/mL Final     CEA   Date Value Ref Range Status   10/01/2015 7.4 (H) 0.0 - 5.0 ng/mL Final     CA 19-9   Date Value Ref Range Status   10/01/2015 38.6 (H) 0 - 36.9 U/mL Final

## 2018-10-02 NOTE — Unmapped (Signed)
Specialty Medication Follow-up    Katherine Harper is a 84 y.o. female with recurrent ovarian cancer who I am seeing for follow up on their treatment with maintenance rucaparib.     Chemotherapy: Rucaparib   Start date: 09/28/18 - 300 mg PO BID                    08/31/18 - 300 mg QAM and 600 QPM                    07/23/18 - 600 mg PO BID    A/P:   1. Oral Chemotherapy: CBC w/diff reviewed. Grade 1 thrombocytopenia and grade 1 anemia both of which have improved. Patient's platelets have returned to baseline therefore will restart rucaparib at this point in time at a reduced dose of 300 mg PO BID. Will repeat labs later this week.   ?? Restart rucaparib 300 mg PO BID  ?? Obtain CBC w/diff, CMP, and CA125 in 1 week    I spent approximately 5 minutes in direct patient care.    Next follow up: In 1 week with labs results.    Hermelinda Medicus, PharmD, BCOP, CPP  Gynecologic Oncology Clinic Pharmacist  Pager: [phone]    S/O: Katherine Harper daughter, Katherine Harper, was contacted regarding recent lab results.     Medications reviewed and updated in EPIC? no    Missed doses: ----    Labs (09/27/18)  WBC 4.8 x10*9/L   Hgb 9.9 g/dL   Plt 91 A54*0/J   ANC 2.3 x10*9/L

## 2018-10-05 MED ORDER — RUCAPARIB 300 MG TABLET
ORAL_TABLET | Freq: Two times a day (BID) | ORAL | 5 refills | 0.00000 days | Status: CP
Start: 2018-10-05 — End: 2018-10-12

## 2018-10-05 NOTE — Unmapped (Signed)
Addended by: Crecencio Mc on: 10/05/2018 11:37 AM     Modules accepted: Orders

## 2018-10-05 NOTE — Unmapped (Signed)
Specialty Medication Follow-up    Katherine Harper is a 84 y.o. female with recurrent ovarian cancer who I am seeing for follow up on their treatment with maintenance rucaparib.     Chemotherapy: Rucaparib   Start date: 09/28/18 - 300 mg PO BID                    08/31/18 - 300 mg QAM and 600 QPM                     07/23/18 - 600 mg PO BID    A/P:   1. Oral Chemotherapy: CBC w/diff and CMP reviewed. Grade 2 anemia and grade 1 thrombocytopenia both of which are stable. No grade 3 toxicities and platelets remain above threshold of 75 therefore will continue at current dose intensity. Given the past history of hematologic toxicity will continue with weekly labs until stabilized on a dose. Given that the CA125 has responded despite frequent dose reductions will further dose reduce to 250 mg PO BID if necessary before consideration of switching to a different PARP inhibitor.   ?? Continue rucaparib 300 mg PO BID  ?? Obtain CBC w/diff in 1 week    I spent approximately 20 minutes in direct patient care.    Next follow up: In 1 week with labs results.    Hermelinda Medicus, PharmD, BCOP, CPP  Gynecologic Oncology Clinic Pharmacist  Pager: [phone]    S/O: Ms. Winders presents to clinic for follow up with Dr. Ruthe Mannan. She reports that since restarting at the reduced rucaparib dose she has not experienced the same level of fatigue and nausea that she previously experienced. She is tolerating this dose well with no new complaints.     Medications reviewed and updated in EPIC? Yes    Missed doses: Several but all as directed    Labs  Infusion on 10/01/2018   Component Date Value Ref Range Status   ??? CA 125 10/01/2018 84.0* 0.0 - 34.9 U/mL Final   ??? Sodium 10/01/2018 142  135 - 145 mmol/L Final   ??? Potassium 10/01/2018 3.8  3.5 - 5.0 mmol/L Final   ??? Chloride 10/01/2018 102  98 - 107 mmol/L Final   ??? CO2 10/01/2018 30.0  22.0 - 30.0 mmol/L Final   ??? BUN 10/01/2018 18  7 - 21 mg/dL Final   ??? Creatinine 10/01/2018 1.00  0.60 - 1.00 mg/dL Final   ??? BUN/Creatinine Ratio 10/01/2018 18   Final   ??? EGFR CKD-EPI Non-African American,* 10/01/2018 51* >=60 mL/min/1.52m2 Final   ??? EGFR CKD-EPI African American, Fem* 10/01/2018 59* >=60 mL/min/1.22m2 Final   ??? Glucose 10/01/2018 107  65 - 179 mg/dL Final   ??? Calcium 45/40/9811 9.3  8.5 - 10.2 mg/dL Final   ??? Albumin 91/47/8295 4.0  3.5 - 5.0 g/dL Final   ??? Total Protein 10/01/2018 6.8  6.5 - 8.3 g/dL Final   ??? Total Bilirubin 10/01/2018 0.7  0.0 - 1.2 mg/dL Final   ??? AST 62/13/0865 32  14 - 38 U/L Final   ??? ALT 10/01/2018 18  <35 U/L Final   ??? Alkaline Phosphatase 10/01/2018 61  38 - 126 U/L Final   ??? Anion Gap 10/01/2018 10  7 - 15 mmol/L Final   ??? WBC 10/01/2018 4.5  4.5 - 11.0 10*9/L Final   ??? RBC 10/01/2018 2.98* 4.00 - 5.20 10*12/L Final   ??? HGB 10/01/2018 9.9* 12.0 - 16.0 g/dL Final   ???  HCT 10/01/2018 30.1* 36.0 - 46.0 % Final   ??? MCV 10/01/2018 100.9* 80.0 - 100.0 fL Final   ??? MCH 10/01/2018 33.3  26.0 - 34.0 pg Final   ??? MCHC 10/01/2018 33.0  31.0 - 37.0 g/dL Final   ??? RDW 16/08/9603 16.7* 12.0 - 15.0 % Final   ??? MPV 10/01/2018 7.8  7.0 - 10.0 fL Final   ??? Platelet 10/01/2018 95* 150 - 440 10*9/L Final   ??? Neutrophils % 10/01/2018 61.7  % Final   ??? Lymphocytes % 10/01/2018 26.7  % Final   ??? Monocytes % 10/01/2018 7.7  % Final   ??? Eosinophils % 10/01/2018 1.7  % Final   ??? Basophils % 10/01/2018 0.5  % Final   ??? Absolute Neutrophils 10/01/2018 2.8  2.0 - 7.5 10*9/L Final   ??? Absolute Lymphocytes 10/01/2018 1.2* 1.5 - 5.0 10*9/L Final   ??? Absolute Monocytes 10/01/2018 0.4  0.2 - 0.8 10*9/L Final   ??? Absolute Eosinophils 10/01/2018 0.1  0.0 - 0.4 10*9/L Final   ??? Absolute Basophils 10/01/2018 0.0  0.0 - 0.1 10*9/L Final   ??? Large Unstained Cells 10/01/2018 2  0 - 4 % Final   ??? Macrocytosis 10/01/2018 Marked* Not Present Final   ??? Anisocytosis 10/01/2018 Slight* Not Present Final   ??? Hypochromasia 10/01/2018 Slight* Not Present Final   ??? Smear Review Comments 10/01/2018 See Comment* Undefined Final Slide Reviewed.

## 2018-10-12 LAB — CBC W/ DIFFERENTIAL
BANDED NEUTROPHILS ABSOLUTE COUNT: 0 10*3/uL (ref 0.0–0.1)
BASOPHILS ABSOLUTE COUNT: 0.1 10*3/uL (ref 0.0–0.2)
EOSINOPHILS ABSOLUTE COUNT: 0.1 10*3/uL (ref 0.0–0.4)
HEMATOCRIT: 29.3 % — ABNORMAL LOW (ref 34.0–46.6)
HEMOGLOBIN: 9.9 g/dL — ABNORMAL LOW (ref 11.1–15.9)
IMMATURE GRANULOCYTES: 0 %
LYMPHOCYTES ABSOLUTE COUNT: 1.9 10*3/uL (ref 0.7–3.1)
LYMPHOCYTES RELATIVE PERCENT: 25 %
MEAN CORPUSCULAR HEMOGLOBIN CONC: 33.8 g/dL (ref 31.5–35.7)
MEAN CORPUSCULAR HEMOGLOBIN: 32.7 pg (ref 26.6–33.0)
MEAN CORPUSCULAR VOLUME: 97 fL (ref 79–97)
MONOCYTES ABSOLUTE COUNT: 0.7 10*3/uL (ref 0.1–0.9)
MONOCYTES RELATIVE PERCENT: 8 %
NEUTROPHILS ABSOLUTE COUNT: 5.2 10*3/uL (ref 1.4–7.0)
NEUTROPHILS RELATIVE PERCENT: 65 %
PLATELET COUNT: 71 10*3/uL — CL (ref 150–450)
RED BLOOD CELL COUNT: 3.03 x10E6/uL — ABNORMAL LOW (ref 3.77–5.28)
RED CELL DISTRIBUTION WIDTH: 15.2 % (ref 12.3–15.4)
WHITE BLOOD CELL COUNT: 7.9 10*3/uL (ref 3.4–10.8)

## 2018-10-12 LAB — RED CELL DISTRIBUTION WIDTH: Lab: 15.2

## 2018-10-12 MED ORDER — RUCAPARIB 250 MG TABLET
ORAL_TABLET | Freq: Two times a day (BID) | ORAL | 5 refills | 0.00000 days
Start: 2018-10-12 — End: 2018-11-09

## 2018-10-13 NOTE — Unmapped (Signed)
Specialty Medication Follow-up    Katherine Harper is a 84 y.o. female with recurrent ovarian cancer who I am seeing for follow up on their treatment with maintenance rucaparib.     Chemotherapy: Rucaparib   Start date: 09/28/18 - 300 mg PO BID                    08/31/18 - 300 mg QAM and 600 QPM                     07/23/18 - 600 mg PO BID    A/P:   1. Oral Chemotherapy: CBC w/diff reviewed. Grade 2 anemia which is stable. Grade 2 thrombocytopenia which has worsened. Given that the platelets have fallen below threshold at 71 will hold rucaparib at repeat labs in 1 week. A new prescription will be sent for the next dose reduction of 250 mg PO BID.  ?? HOLD rucaparib 300 mg PO BID  ?? Obtain CBC w/diff in 1 week    I spent approximately 5 minutes in direct patient care.    Next follow up: In 1 week with labs results.    Katherine Harper, PharmD, BCOP, CPP  Gynecologic Oncology Clinic Pharmacist  Pager: [phone]    S/O: Katherine Harper daughter, Katherine Harper, was contacted regarding recent lab results. She reports that overall the patient continues to feel well with no new complaints.     Medications reviewed and updated in EPIC? No    Missed doses: -----    Labs  No visits with results within 1 Day(s) from this visit.   Latest known visit with results is:   Ancillary Orders on 10/04/2018   Component Date Value Ref Range Status   ??? WBC 10/11/2018 7.9  3.4 - 10.8 x10E3/uL Final   ??? RBC 10/11/2018 3.03* 3.77 - 5.28 x10E6/uL Final   ??? HGB 10/11/2018 9.9* 11.1 - 15.9 g/dL Final   ??? HCT 56/21/3086 29.3* 34.0 - 46.6 % Final   ??? MCV 10/11/2018 97  79 - 97 fL Final   ??? MCH 10/11/2018 32.7  26.6 - 33.0 pg Final   ??? MCHC 10/11/2018 33.8  31.5 - 35.7 g/dL Final   ??? RDW 57/84/6962 15.2  12.3 - 15.4 % Final   ??? Platelet 10/11/2018 71* 150 - 450 x10E3/uL Final    Platelet count verified by examination of peripheral blood smear.   ??? Neutrophils % 10/11/2018 65  Not Estab. % Final   ??? Lymphocytes % 10/11/2018 25  Not Estab. % Final   ??? Monocytes % 10/11/2018 8  Not Estab. % Final   ??? Eosinophils % 10/11/2018 1  Not Estab. % Final   ??? Basophils % 10/11/2018 1  Not Estab. % Final   ??? Absolute Neutrophils 10/11/2018 5.2  1.4 - 7.0 x10E3/uL Final   ??? Absolute Lymphocytes 10/11/2018 1.9  0.7 - 3.1 x10E3/uL Final   ??? Absolute Monocytes  10/11/2018 0.7  0.1 - 0.9 x10E3/uL Final   ??? Absolute Eosinophils 10/11/2018 0.1  0.0 - 0.4 x10E3/uL Final   ??? Absolute Basophils  10/11/2018 0.1  0.0 - 0.2 x10E3/uL Final   ??? Immature Granulocytes 10/11/2018 0  Not Estab. % Final   ??? Bands Absolute 10/11/2018 0.0  0.0 - 0.1 x10E3/uL Final   ??? Hematology Comments: 10/11/2018 Note:   Final    Verified by microscopic examination.

## 2018-10-19 LAB — COMPREHENSIVE METABOLIC PANEL
A/G RATIO: 2 (ref 1.2–2.2)
ALBUMIN: 4.3 g/dL (ref 3.5–4.7)
ALKALINE PHOSPHATASE: 76 IU/L (ref 39–117)
ALT (SGPT): 17 IU/L (ref 0–32)
AST (SGOT): 31 IU/L (ref 0–40)
BILIRUBIN TOTAL: 0.4 mg/dL (ref 0.0–1.2)
BLOOD UREA NITROGEN: 14 mg/dL (ref 8–27)
CALCIUM: 9.7 mg/dL (ref 8.7–10.3)
CHLORIDE: 103 mmol/L (ref 96–106)
CO2: 26 mmol/L (ref 20–29)
CREATININE: 0.91 mg/dL (ref 0.57–1.00)
GFR MDRD AF AMER: 66 mL/min/{1.73_m2}
GLOBULIN, TOTAL: 2.2 g/dL (ref 1.5–4.5)
GLUCOSE: 91 mg/dL (ref 65–99)
POTASSIUM: 4.2 mmol/L (ref 3.5–5.2)
TOTAL PROTEIN: 6.5 g/dL (ref 6.0–8.5)

## 2018-10-19 LAB — CBC W/ DIFFERENTIAL
BANDED NEUTROPHILS ABSOLUTE COUNT: 0 10*3/uL (ref 0.0–0.1)
BASOPHILS ABSOLUTE COUNT: 0.1 10*3/uL (ref 0.0–0.2)
BASOPHILS RELATIVE PERCENT: 1 %
EOSINOPHILS ABSOLUTE COUNT: 0.2 10*3/uL (ref 0.0–0.4)
EOSINOPHILS RELATIVE PERCENT: 3 %
HEMATOCRIT: 29.4 % — ABNORMAL LOW (ref 34.0–46.6)
HEMOGLOBIN: 10.1 g/dL — ABNORMAL LOW (ref 11.1–15.9)
IMMATURE GRANULOCYTES: 0 %
MEAN CORPUSCULAR HEMOGLOBIN CONC: 34.4 g/dL (ref 31.5–35.7)
MEAN CORPUSCULAR HEMOGLOBIN: 34.1 pg — ABNORMAL HIGH (ref 26.6–33.0)
MEAN CORPUSCULAR VOLUME: 99 fL — ABNORMAL HIGH (ref 79–97)
MONOCYTES ABSOLUTE COUNT: 0.5 10*3/uL (ref 0.1–0.9)
MONOCYTES RELATIVE PERCENT: 9 %
NEUTROPHILS RELATIVE PERCENT: 57 %
PLATELET COUNT: 72 10*3/uL — CL (ref 150–450)
RED BLOOD CELL COUNT: 2.96 x10E6/uL — ABNORMAL LOW (ref 3.77–5.28)
RED CELL DISTRIBUTION WIDTH: 14.9 % (ref 12.3–15.4)
WHITE BLOOD CELL COUNT: 5.3 10*3/uL (ref 3.4–10.8)

## 2018-10-19 LAB — MEAN CORPUSCULAR HEMOGLOBIN: Lab: 34.1 — ABNORMAL HIGH

## 2018-10-19 LAB — POTASSIUM: Lab: 4.2

## 2018-10-20 NOTE — Unmapped (Signed)
Specialty Medication Follow-up    Katherine Harper is a 84 y.o. female with recurrent ovarian cancer who I am seeing for follow up on their treatment with maintenance rucaparib.     Chemotherapy: Rucaparib   Start date: 09/28/18 - 300 mg PO BID                    08/31/18 - 300 mg QAM and 600 QPM                     07/23/18 - 600 mg PO BID    A/P:   1. Oral Chemotherapy: CBC w/diff and CMP reviewed. Grade 1 anemia and grade 1 thrombocytopenia both of which are stable. Platelet treatment parameters are not met to restart rucaparib at a reduced dose therefore rucaparib will continue to be on HOLD. Will repeat CBC w/diff in 1 week to reassess treatment parameters.   ?? HOLD rucaparib 300 mg PO BID  ?? Obtain CBC w/diff in 1 week    I spent approximately 5 minutes in direct patient care.    Next follow up: In 1 week with labs results.    Hermelinda Medicus, PharmD, BCOP, CPP  Gynecologic Oncology Clinic Pharmacist  Pager: [phone]    S/O: Ms. Astorino daughter, Claris Che, was contacted regarding recent lab results. New rucaparib dose will be delivered 10/22/18.     Medications reviewed and updated in EPIC? No    Missed doses: -----    Labs  No visits with results within 1 Day(s) from this visit.   Latest known visit with results is:   Ancillary Orders on 10/18/2018   Component Date Value Ref Range Status   ??? WBC 10/18/2018 5.3  3.4 - 10.8 x10E3/uL Final   ??? RBC 10/18/2018 2.96* 3.77 - 5.28 x10E6/uL Final   ??? HGB 10/18/2018 10.1* 11.1 - 15.9 g/dL Final   ??? HCT 45/40/9811 29.4* 34.0 - 46.6 % Final   ??? MCV 10/18/2018 99* 79 - 97 fL Final   ??? MCH 10/18/2018 34.1* 26.6 - 33.0 pg Final   ??? MCHC 10/18/2018 34.4  31.5 - 35.7 g/dL Final   ??? RDW 91/47/8295 14.9  12.3 - 15.4 % Final    Comment: **Effective November 15, 2018, the RDW pediatric reference**    interval will be removed and the adult reference interval    will be changing to:                             Female 11.7 - 15.4                                                       Female 11.6 - 15.4     ??? Platelet 10/18/2018 72* 150 - 450 x10E3/uL Final    Comment: Actual platelet count may be somewhat higher than reported due to  aggregation of platelets in this sample.     ??? Neutrophils % 10/18/2018 57  Not Estab. % Final   ??? Lymphocytes % 10/18/2018 30  Not Estab. % Final   ??? Monocytes % 10/18/2018 9  Not Estab. % Final   ??? Eosinophils % 10/18/2018 3  Not Estab. % Final   ??? Basophils % 10/18/2018 1  Not Estab. % Final   ???  Absolute Neutrophils 10/18/2018 3.0  1.4 - 7.0 x10E3/uL Final   ??? Absolute Lymphocytes 10/18/2018 1.6  0.7 - 3.1 x10E3/uL Final   ??? Absolute Monocytes  10/18/2018 0.5  0.1 - 0.9 x10E3/uL Final   ??? Absolute Eosinophils 10/18/2018 0.2  0.0 - 0.4 x10E3/uL Final   ??? Absolute Basophils  10/18/2018 0.1  0.0 - 0.2 x10E3/uL Final   ??? Immature Granulocytes 10/18/2018 0  Not Estab. % Final   ??? Bands Absolute 10/18/2018 0.0  0.0 - 0.1 x10E3/uL Final   ??? Hematology Comments: 10/18/2018 Note:   Final    Verified by microscopic examination.   ??? Glucose 10/18/2018 91  65 - 99 mg/dL Final   ??? BUN 57/84/6962 14  8 - 27 mg/dL Final   ??? Creatinine 10/18/2018 0.91  0.57 - 1.00 mg/dL Final   ??? GFR MDRD Non Af Amer 10/18/2018 57* >59 mL/min/1.73 Final   ??? GFR MDRD Af Amer 10/18/2018 66  >59 mL/min/1.73 Final   ??? BUN/Creatinine Ratio 10/18/2018 15  12 - 28 Final   ??? Sodium 10/18/2018 144  134 - 144 mmol/L Final   ??? Potassium 10/18/2018 4.2  3.5 - 5.2 mmol/L Final   ??? Chloride 10/18/2018 103  96 - 106 mmol/L Final   ??? CO2 10/18/2018 26  20 - 29 mmol/L Final   ??? Calcium 10/18/2018 9.7  8.7 - 10.3 mg/dL Final   ??? Total Protein 10/18/2018 6.5  6.0 - 8.5 g/dL Final   ??? Albumin 95/28/4132 4.3  3.5 - 4.7 g/dL Final   ??? Globulin, Total 10/18/2018 2.2  1.5 - 4.5 g/dL Final   ??? A/G Ratio 44/11/270 2.0  1.2 - 2.2 Final   ??? Total Bilirubin 10/18/2018 0.4  0.0 - 1.2 mg/dL Final   ??? Alkaline Phosphatase 10/18/2018 76  39 - 117 IU/L Final   ??? AST 10/18/2018 31  0 - 40 IU/L Final   ??? ALT 10/18/2018 17  0 - 32 IU/L Final

## 2018-10-21 ENCOUNTER — Ambulatory Visit: Admit: 2018-10-21 | Discharge: 2018-10-22 | Payer: MEDICARE | Attending: Vascular Surgery | Primary: Vascular Surgery

## 2018-10-21 DIAGNOSIS — I716 Thoracoabdominal aortic aneurysm, without rupture: Principal | ICD-10-CM

## 2018-10-21 NOTE — Unmapped (Signed)
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 Patient is a participant in Dr. Danae Orleans IDE clinical trial.    PHYSICAL EXAM:     VITAL SIGNS: 192/79, (retaken 178/72), 70, 18    GENERAL: Patient is awake, alert and oriented. Patient states she is doing very well, and denies any chest pain, SOB, TIA sx, claudication symptoms, back pain, lower extremity pain or abdominal pain.  She continues to be followed for Katherine cancer treatment, and is on oral chemotherapy. Patient reports that Katherine oncologist discontinued Katherine ASA secondary to platelet counts.     HEENT: Extraocular muscles are intact.    HEART: Regular rate and rhythm.    LUNGS: Clear and unlabored    ABDOMEN: Nontender, nondistended. Patient states that Katherine bowel movements are normal and she states that she has no difficulty with urination and denies blood in Katherine urine or stool. She states that Katherine appetite is good, and denies pain, nausea or vomiting associated with eating.Marland Kitchen    EXTREMITIES: No clubbing, cyanosis noted. She is noted to have bilateral lower extremity edema  Which the patient states is secondary to Katherine chemo treatment. She states that Katherine cardiologist is monitoring this. Moves all extremities well. She has palpable DP/PT pulses bilaterally.      ASSESSMENT: Dr. Pattricia Boss reviewed the patient's CT scan and Renal Duplex, and noted that the aneurysm sac is stable, and no endoleak is present.. Dr. Pattricia Boss discussed results with the patient and Katherine Harper.      PLAN: Patient was instructed to rtc in one year for repeat CT scan, Renal Mesenteric Duplex, X-ray and lab work.

## 2018-10-26 LAB — CBC W/ DIFFERENTIAL
BANDED NEUTROPHILS ABSOLUTE COUNT: 0 10*3/uL (ref 0.0–0.1)
BASOPHILS ABSOLUTE COUNT: 0.1 10*3/uL (ref 0.0–0.2)
BASOPHILS RELATIVE PERCENT: 1 %
EOSINOPHILS ABSOLUTE COUNT: 0.1 10*3/uL (ref 0.0–0.4)
EOSINOPHILS RELATIVE PERCENT: 1 %
HEMATOCRIT: 30.1 % — ABNORMAL LOW (ref 34.0–46.6)
HEMOGLOBIN: 10.1 g/dL — ABNORMAL LOW (ref 11.1–15.9)
IMMATURE GRANULOCYTES: 0 %
LYMPHOCYTES ABSOLUTE COUNT: 1.3 10*3/uL (ref 0.7–3.1)
LYMPHOCYTES RELATIVE PERCENT: 28 %
MEAN CORPUSCULAR HEMOGLOBIN CONC: 33.6 g/dL (ref 31.5–35.7)
MEAN CORPUSCULAR HEMOGLOBIN: 33.2 pg — ABNORMAL HIGH (ref 26.6–33.0)
MEAN CORPUSCULAR VOLUME: 99 fL — ABNORMAL HIGH (ref 79–97)
MONOCYTES ABSOLUTE COUNT: 0.4 10*3/uL (ref 0.1–0.9)
NEUTROPHILS RELATIVE PERCENT: 62 %
PLATELET COUNT: 111 10*3/uL — ABNORMAL LOW (ref 150–450)
RED BLOOD CELL COUNT: 3.04 x10E6/uL — ABNORMAL LOW (ref 3.77–5.28)
RED CELL DISTRIBUTION WIDTH: 14.6 % (ref 12.3–15.4)
WHITE BLOOD CELL COUNT: 4.7 10*3/uL (ref 3.4–10.8)

## 2018-10-26 LAB — BANDED NEUTROPHILS ABSOLUTE COUNT: Lab: 0

## 2018-10-27 NOTE — Unmapped (Signed)
Specialty Medication Follow-up    Katherine Harper is a 84 y.o. female with recurrent ovarian cancer who I am seeing for follow up on their treatment with maintenance rucaparib.     Chemotherapy: Rucaparib   Start date: 09/28/18 - 300 mg PO BID                    08/31/18 - 300 mg QAM and 600 QPM                     07/23/18 - 600 mg PO BID    A/P:   1. Oral Chemotherapy: CBC w/diff and CMP reviewed. Grade 1 anemia which is stable. Grade 1 thrombocytopenia which has improved. Patient now meets treatment parameters to restart rucaparib. She will be started at the reduced dose of 250 mg PO BID. Labs will be repeated in 2 weeks for hematologic toxicity assessment.   ?? Restart rucaparib 250 mg PO BID  ?? Obtain CBC w/diff in 2 weeks    I spent approximately 5 minutes in direct patient care.    Next follow up: In 2 weeks with labs results.    Hermelinda Medicus, PharmD, BCOP, CPP  Gynecologic Oncology Clinic Pharmacist  Pager: [phone]    S/O: Ms. Cuadra daughter, Claris Che, was contacted regarding recent lab results. Confirmed that she has received the new reduced dose rucaparib.     Medications reviewed and updated in EPIC? No    Missed doses: -----    Labs (10/25/18)  WBC 4.7 x10*9/L   Hgb 10.1 g/dL   Plt 454 U98*1/X   ANC 2.9 x10*9/L

## 2018-11-09 LAB — CBC W/ DIFFERENTIAL
BANDED NEUTROPHILS ABSOLUTE COUNT: 0 10*3/uL (ref 0.0–0.1)
BASOPHILS ABSOLUTE COUNT: 0.1 10*3/uL (ref 0.0–0.2)
BASOPHILS RELATIVE PERCENT: 1 %
EOSINOPHILS ABSOLUTE COUNT: 0.3 10*3/uL (ref 0.0–0.4)
EOSINOPHILS RELATIVE PERCENT: 5 %
HEMATOCRIT: 29.1 % — ABNORMAL LOW (ref 34.0–46.6)
HEMOGLOBIN: 9.8 g/dL — ABNORMAL LOW (ref 11.1–15.9)
IMMATURE GRANULOCYTES: 0 %
LYMPHOCYTES ABSOLUTE COUNT: 1.5 10*3/uL (ref 0.7–3.1)
MEAN CORPUSCULAR HEMOGLOBIN CONC: 33.7 g/dL (ref 31.5–35.7)
MEAN CORPUSCULAR HEMOGLOBIN: 33.1 pg — ABNORMAL HIGH (ref 26.6–33.0)
MEAN CORPUSCULAR VOLUME: 98 fL — ABNORMAL HIGH (ref 79–97)
MONOCYTES ABSOLUTE COUNT: 0.5 10*3/uL (ref 0.1–0.9)
MONOCYTES RELATIVE PERCENT: 10 %
NEUTROPHILS RELATIVE PERCENT: 54 %
PLATELET COUNT: 74 10*3/uL — CL (ref 150–450)
RED BLOOD CELL COUNT: 2.96 x10E6/uL — ABNORMAL LOW (ref 3.77–5.28)
RED CELL DISTRIBUTION WIDTH: 14.2 % (ref 12.3–15.4)
WHITE BLOOD CELL COUNT: 5 10*3/uL (ref 3.4–10.8)

## 2018-11-09 LAB — HEMATOCRIT: Lab: 29.1 — ABNORMAL LOW

## 2018-11-09 MED ORDER — RUCAPARIB 200 MG TABLET
ORAL_TABLET | Freq: Two times a day (BID) | ORAL | 5 refills | 0 days | Status: CP
Start: 2018-11-09 — End: 2018-12-03

## 2018-11-09 NOTE — Unmapped (Signed)
Specialty Medication Follow-up    Katherine Harper is a 84 y.o. female with recurrent ovarian cancer who I am seeing for follow up on their treatment with maintenance rucaparib.     Chemotherapy: Rucaparib   Start date: 10/27/18 - 250 mg PO BID                    09/28/18 - 300 mg PO BID                    08/31/18 - 300 mg QAM and 600 QPM                    07/23/18 - 600 mg PO BID    A/P:   1. Oral Chemotherapy: CBC w/diff reviewed. Grade 2 anemia and grade 2 thrombocytopenia both of which have worsened. Given the grade 2 thrombocytopenia will hold rucaparib until platelets recover and further dose reduce down to 200 mg PO BID. New prescription will be submitted for dose reduction. Will repeat labs in 2 weeks to reassess ability to restart reduced dose rucaparib.   ?? HOLD rucaparib 250 mg PO BID  ?? Obtain CBC w/diff in 2 weeks    I spent approximately 5 minutes in direct patient care.    Next follow up: In 2 weeks with labs results.    Katherine Harper, PharmD, BCOP, CPP  Gynecologic Oncology Clinic Pharmacist  Pager: [phone]    S/O: Katherine Harper daughter, Katherine Harper, was contacted regarding recent lab results. She reports no new adverse effects.     Medications reviewed and updated in EPIC? No    Missed doses: -----    Labs  No visits with results within 1 Day(s) from this visit.   Latest known visit with results is:   Ancillary Orders on 11/01/2018   Component Date Value Ref Range Status   ??? WBC 11/08/2018 5.0  3.4 - 10.8 x10E3/uL Final   ??? RBC 11/08/2018 2.96* 3.77 - 5.28 x10E6/uL Final   ??? HGB 11/08/2018 9.8* 11.1 - 15.9 g/dL Final   ??? HCT 45/40/9811 29.1* 34.0 - 46.6 % Final   ??? MCV 11/08/2018 98* 79 - 97 fL Final   ??? MCH 11/08/2018 33.1* 26.6 - 33.0 pg Final   ??? MCHC 11/08/2018 33.7  31.5 - 35.7 g/dL Final   ??? RDW 91/47/8295 14.2  12.3 - 15.4 % Final    Comment: **Effective November 15, 2018, the RDW pediatric reference**    interval will be removed and the adult reference interval    will be changing to: Female 11.7 - 15.4                                                       Female 11.6 - 15.4     ??? Platelet 11/08/2018 74* 150 - 450 x10E3/uL Final   ??? Neutrophils % 11/08/2018 54  Not Estab. % Final   ??? Lymphocytes % 11/08/2018 30  Not Estab. % Final   ??? Monocytes % 11/08/2018 10  Not Estab. % Final   ??? Eosinophils % 11/08/2018 5  Not Estab. % Final   ??? Basophils % 11/08/2018 1  Not Estab. % Final   ??? Absolute Neutrophils 11/08/2018 2.7  1.4 - 7.0 x10E3/uL Final   ??? Absolute Lymphocytes 11/08/2018 1.5  0.7 - 3.1 x10E3/uL Final   ??? Absolute Monocytes  11/08/2018 0.5  0.1 - 0.9 x10E3/uL Final   ??? Absolute Eosinophils 11/08/2018 0.3  0.0 - 0.4 x10E3/uL Final   ??? Absolute Basophils  11/08/2018 0.1  0.0 - 0.2 x10E3/uL Final   ??? Immature Granulocytes 11/08/2018 0  Not Estab. % Final   ??? Bands Absolute 11/08/2018 0.0  0.0 - 0.1 x10E3/uL Final   ??? Hematology Comments: 11/08/2018 Note:   Final    Verified by microscopic examination.

## 2018-11-23 LAB — CBC W/ DIFFERENTIAL
BANDED NEUTROPHILS ABSOLUTE COUNT: 0 10*3/uL (ref 0.0–0.1)
BASOPHILS ABSOLUTE COUNT: 0.1 10*3/uL (ref 0.0–0.2)
BASOPHILS RELATIVE PERCENT: 1 %
EOSINOPHILS ABSOLUTE COUNT: 0.1 10*3/uL (ref 0.0–0.4)
EOSINOPHILS RELATIVE PERCENT: 2 %
HEMATOCRIT: 31.7 % — ABNORMAL LOW (ref 34.0–46.6)
HEMOGLOBIN: 10.4 g/dL — ABNORMAL LOW (ref 11.1–15.9)
IMMATURE GRANULOCYTES: 0 %
LYMPHOCYTES ABSOLUTE COUNT: 1.6 10*3/uL (ref 0.7–3.1)
LYMPHOCYTES RELATIVE PERCENT: 31 %
MEAN CORPUSCULAR HEMOGLOBIN CONC: 32.8 g/dL (ref 31.5–35.7)
MEAN CORPUSCULAR HEMOGLOBIN: 33.2 pg — ABNORMAL HIGH (ref 26.6–33.0)
MEAN CORPUSCULAR VOLUME: 101 fL — ABNORMAL HIGH (ref 79–97)
MONOCYTES ABSOLUTE COUNT: 0.5 10*3/uL (ref 0.1–0.9)
MONOCYTES RELATIVE PERCENT: 10 %
NEUTROPHILS RELATIVE PERCENT: 56 %
PLATELET COUNT: 81 10*3/uL — CL (ref 150–450)
RED BLOOD CELL COUNT: 3.13 x10E6/uL — ABNORMAL LOW (ref 3.77–5.28)
WHITE BLOOD CELL COUNT: 5.1 10*3/uL (ref 3.4–10.8)

## 2018-11-23 LAB — COMPREHENSIVE METABOLIC PANEL
A/G RATIO: 1.6 (ref 1.2–2.2)
ALBUMIN: 4 g/dL (ref 3.5–4.7)
ALKALINE PHOSPHATASE: 76 IU/L (ref 39–117)
ALT (SGPT): 13 IU/L (ref 0–32)
BILIRUBIN TOTAL: 0.3 mg/dL (ref 0.0–1.2)
BUN / CREAT RATIO: 19 (ref 12–28)
CALCIUM: 9.3 mg/dL (ref 8.7–10.3)
CHLORIDE: 104 mmol/L (ref 96–106)
CREATININE: 0.78 mg/dL (ref 0.57–1.00)
GFR MDRD AF AMER: 79 mL/min/{1.73_m2}
GFR MDRD NON AF AMER: 69 mL/min/{1.73_m2}
GLOBULIN, TOTAL: 2.5 g/dL (ref 1.5–4.5)
GLUCOSE: 92 mg/dL (ref 65–99)
SODIUM: 147 mmol/L — ABNORMAL HIGH (ref 134–144)
TOTAL PROTEIN: 6.5 g/dL (ref 6.0–8.5)

## 2018-11-23 LAB — LYMPHOCYTES RELATIVE PERCENT: Lab: 31

## 2018-11-23 LAB — ALKALINE PHOSPHATASE: Lab: 76

## 2018-11-24 NOTE — Unmapped (Signed)
Spoke with patients daughter Claris Che advised of insurance forms not signed by patient. Margaret requested forms be mailed to back to mother for completion copy scanned to media.

## 2018-11-26 NOTE — Unmapped (Signed)
Specialty Medication Follow-up    Katherine Harper is a 85 y.o. female with recurrent ovarian cancer who I am seeing for follow up on their treatment with maintenance rucaparib.     Chemotherapy: Rucaparib   Start date: 10/27/18 - 250 mg PO BID                    09/28/18 - 300 mg PO BID                    08/31/18 - 300 mg QAM and 600 QPM                    07/23/18 - 600 mg PO BID    A/P:   1. Oral Chemotherapy: CBC w/diff and CMP reviewed. Grade 1 anemia and grade 1 thrombocytopenia both of which have improved. Platelets do not yet meet treatment parameters to restart rucaparib at the reduced dose of 200 mg PO BID. Given the difficulty with continuing on PARP inhibitor therapy given her bone marrow will readdress at next clinic visit. Will discuss continued rucaparib at reduced dose vs switching to a different PARP inhibitor therapy. Will repeat labs in 2 weeks to reassess if patient can restart a PARP inhibitor.   ?? HOLD rucaparib 250 mg PO BID  ?? Obtain CBC w/diff in 2 weeks    I spent approximately 5 minutes in direct patient care.    Next follow up: In 2 weeks at clinic appointment    Hermelinda Medicus, PharmD, BCOP, CPP  Gynecologic Oncology Clinic Pharmacist  Pager: [phone]    S/O: Ms. Polak daughter, Claris Che, was contacted regarding recent lab results. She reports increase hair loss overall states that she is starting to feel the effects from PARP inhibitor therapy. She has not yet had the further dose reduction shipped to her house.     Medications reviewed and updated in EPIC? No    Missed doses: -----    Labs  No visits with results within 1 Day(s) from this visit.   Latest known visit with results is:   Ancillary Orders on 11/15/2018   Component Date Value Ref Range Status   ??? WBC 11/22/2018 5.1  3.4 - 10.8 x10E3/uL Final   ??? RBC 11/22/2018 3.13* 3.77 - 5.28 x10E6/uL Final   ??? HGB 11/22/2018 10.4* 11.1 - 15.9 g/dL Final   ??? HCT 19/14/7829 31.7* 34.0 - 46.6 % Final   ??? MCV 11/22/2018 101* 79 - 97 fL Final ??? MCH 11/22/2018 33.2* 26.6 - 33.0 pg Final   ??? MCHC 11/22/2018 32.8  31.5 - 35.7 g/dL Final   ??? RDW 56/21/3086 13.7  11.7 - 15.4 % Final                  **Please note reference interval change**   ??? Platelet 11/22/2018 81* 150 - 450 x10E3/uL Final    Comment: Actual platelet count may be somewhat higher than reported due to  aggregation of platelets in this sample.     ??? Neutrophils % 11/22/2018 56  Not Estab. % Final   ??? Lymphocytes % 11/22/2018 31  Not Estab. % Final   ??? Monocytes % 11/22/2018 10  Not Estab. % Final   ??? Eosinophils % 11/22/2018 2  Not Estab. % Final   ??? Basophils % 11/22/2018 1  Not Estab. % Final   ??? Absolute Neutrophils 11/22/2018 2.9  1.4 - 7.0 x10E3/uL Final   ??? Absolute Lymphocytes 11/22/2018  1.6  0.7 - 3.1 x10E3/uL Final   ??? Absolute Monocytes  11/22/2018 0.5  0.1 - 0.9 x10E3/uL Final   ??? Absolute Eosinophils 11/22/2018 0.1  0.0 - 0.4 x10E3/uL Final   ??? Absolute Basophils  11/22/2018 0.1  0.0 - 0.2 x10E3/uL Final   ??? Immature Granulocytes 11/22/2018 0  Not Estab. % Final   ??? Bands Absolute 11/22/2018 0.0  0.0 - 0.1 x10E3/uL Final   ??? Hematology Comments: 11/22/2018 Note:   Final    Verified by microscopic examination.   ??? Glucose 11/22/2018 92  65 - 99 mg/dL Final   ??? BUN 40/08/2724 15  8 - 27 mg/dL Final   ??? Creatinine 11/22/2018 0.78  0.57 - 1.00 mg/dL Final   ??? GFR MDRD Non Af Amer 11/22/2018 69  >59 mL/min/1.73 Final   ??? GFR MDRD Af Amer 11/22/2018 79  >59 mL/min/1.73 Final   ??? BUN/Creatinine Ratio 11/22/2018 19  12 - 28 Final   ??? Sodium 11/22/2018 147* 134 - 144 mmol/L Final   ??? Potassium 11/22/2018 4.2  3.5 - 5.2 mmol/L Final   ??? Chloride 11/22/2018 104  96 - 106 mmol/L Final   ??? CO2 11/22/2018 28  20 - 29 mmol/L Final   ??? Calcium 11/22/2018 9.3  8.7 - 10.3 mg/dL Final   ??? Total Protein 11/22/2018 6.5  6.0 - 8.5 g/dL Final   ??? Albumin 36/64/4034 4.0  3.5 - 4.7 g/dL Final    Comment:     **Effective November 29, 2018 Albumin reference**        interval will be changing to: Age                Female          Female            0 -  7 days        3.6 - 4.9      3.6 - 4.9            8 - 30 days        3.4 - 4.7      3.4 - 4.7            1 -  6 month       3.7 - 4.8      3.7 - 4.8     7 months -  2 years       3.9 - 5.0      3.9 - 5.0            3 -  5 years       4.0 - 5.0      4.0 - 5.0            6 - 12 years       4.1 - 5.0      4.0 - 5.0           13 - 30 years       4.1 - 5.2      3.9 - 5.0           31 - 50 years       4.0 - 5.0      3.8 - 4.8           51 - 60 years       3.8 - 4.9      3.8 - 4.9           61 - 70  years       3.8 - 4.8      3.8 - 4.8           71 - 80 years       3.7 - 4.7      3.7 - 4.7           81 - 89 years       3.6 - 4.6      3.6 - 4.6               >89 years       3.5 - 4.6      3.5 - 4.6     ??? Globulin, Total 11/22/2018 2.5  1.5 - 4.5 g/dL Final   ??? A/G Ratio 11/12/7251 1.6  1.2 - 2.2 Final   ??? Total Bilirubin 11/22/2018 0.3  0.0 - 1.2 mg/dL Final   ??? Alkaline Phosphatase 11/22/2018 76  39 - 117 IU/L Final   ??? AST 11/22/2018 27  0 - 40 IU/L Final   ??? ALT 11/22/2018 13  0 - 32 IU/L Final

## 2018-12-03 ENCOUNTER — Ambulatory Visit
Admit: 2018-12-03 | Discharge: 2018-12-03 | Payer: MEDICARE | Attending: Gynecologic Oncology | Primary: Gynecologic Oncology

## 2018-12-03 ENCOUNTER — Ambulatory Visit: Admit: 2018-12-03 | Discharge: 2018-12-03 | Payer: MEDICARE

## 2018-12-03 ENCOUNTER — Ambulatory Visit
Admit: 2018-12-03 | Discharge: 2018-12-03 | Payer: MEDICARE | Attending: Nurse Practitioner | Primary: Nurse Practitioner

## 2018-12-03 DIAGNOSIS — C569 Malignant neoplasm of unspecified ovary: Principal | ICD-10-CM

## 2018-12-03 DIAGNOSIS — C482 Malignant neoplasm of peritoneum, unspecified: Principal | ICD-10-CM

## 2018-12-03 LAB — CBC W/ AUTO DIFF
BASOPHILS ABSOLUTE COUNT: 0 10*9/L (ref 0.0–0.1)
BASOPHILS RELATIVE PERCENT: 0.4 %
EOSINOPHILS ABSOLUTE COUNT: 0.1 10*9/L (ref 0.0–0.4)
EOSINOPHILS RELATIVE PERCENT: 1.3 %
HEMOGLOBIN: 10.4 g/dL — ABNORMAL LOW (ref 12.0–16.0)
LARGE UNSTAINED CELLS: 2 % (ref 0–4)
LYMPHOCYTES ABSOLUTE COUNT: 1.3 10*9/L — ABNORMAL LOW (ref 1.5–5.0)
LYMPHOCYTES RELATIVE PERCENT: 21.4 %
MEAN CORPUSCULAR HEMOGLOBIN CONC: 31.5 g/dL (ref 31.0–37.0)
MEAN CORPUSCULAR HEMOGLOBIN: 31.6 pg (ref 26.0–34.0)
MEAN CORPUSCULAR VOLUME: 100.5 fL — ABNORMAL HIGH (ref 80.0–100.0)
MEAN PLATELET VOLUME: 8.1 fL (ref 7.0–10.0)
MONOCYTES ABSOLUTE COUNT: 0.5 10*9/L (ref 0.2–0.8)
MONOCYTES RELATIVE PERCENT: 7.9 %
NEUTROPHILS ABSOLUTE COUNT: 4.2 10*9/L (ref 2.0–7.5)
NEUTROPHILS RELATIVE PERCENT: 67.2 %
RED CELL DISTRIBUTION WIDTH: 15.3 % — ABNORMAL HIGH (ref 12.0–15.0)
WBC ADJUSTED: 6.2 10*9/L (ref 4.5–11.0)

## 2018-12-03 LAB — COMPREHENSIVE METABOLIC PANEL
ALBUMIN: 4 g/dL (ref 3.5–5.0)
ALKALINE PHOSPHATASE: 72 U/L (ref 38–126)
ALT (SGPT): 15 U/L (ref <35)
ANION GAP: 8 mmol/L (ref 7–15)
BLOOD UREA NITROGEN: 17 mg/dL (ref 7–21)
BUN / CREAT RATIO: 19
CALCIUM: 9.4 mg/dL (ref 8.5–10.2)
CHLORIDE: 99 mmol/L (ref 98–107)
CO2: 31 mmol/L — ABNORMAL HIGH (ref 22.0–30.0)
CREATININE: 0.89 mg/dL (ref 0.60–1.00)
EGFR CKD-EPI AA FEMALE: 67 mL/min/{1.73_m2} (ref >=60)
EGFR CKD-EPI NON-AA FEMALE: 58 mL/min/{1.73_m2} — ABNORMAL LOW (ref >=60)
GLUCOSE RANDOM: 98 mg/dL (ref 70–179)
POTASSIUM: 4.1 mmol/L (ref 3.5–5.0)
PROTEIN TOTAL: 7 g/dL (ref 6.5–8.3)
SODIUM: 138 mmol/L (ref 135–145)

## 2018-12-03 LAB — ALBUMIN: Albumin:MCnc:Pt:Ser/Plas:Qn:: 4

## 2018-12-03 LAB — CA 125: Cancer Ag 125:ACnc:Pt:Ser/Plas:Qn:: 91.5 — ABNORMAL HIGH

## 2018-12-03 LAB — MONOCYTES ABSOLUTE COUNT: Lab: 0.5

## 2018-12-03 MED ORDER — OLAPARIB 100 MG TABLET
ORAL_CAPSULE | Freq: Two times a day (BID) | ORAL | 5 refills | 0 days | Status: CP
Start: 2018-12-03 — End: 2019-02-07
  Filled 2018-12-23: qty 120, 30d supply, fill #0

## 2018-12-03 NOTE — Unmapped (Signed)
Followup Visit Note      Patient Name: Katherine Harper  Patient Age: 85 y.o.  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  Have had her on Rucaparib which has brought down her Ca1 25.  Her platelets have not tolerated this however and despite those adjustments her platelets are still low prohibiting keeping her on a good schedule.  We will try Olaparib as this should be less platelet toxic.  If this does not work I will sit with her and her family to decide what other options we have to keep her disease stable.  She is completely asymptomatic from her cancer.  All of her questions were answered.    Reason for visit  Primary peritoneal cancer    Patient is here to discuss ongoing plans for treatment.      I have independently reviewed the patient's Medical Records     Interval History:    Oncology History    01/04/2016  Paclitaxel/Carbo        Adenocarcinoma (CMS-HCC)    11/06/2015 Initial Diagnosis     Adenocarcinoma (RAF-HCC)      12/03/2015 Surgery     Robotic-assisted bilateral salpingo-oophorectomy with biopsy of peripancreatic mass and lysis of adhesions greater than 45 minutes      01/04/2016 - 06/18/2016 Chemotherapy     Paclitaxel/carboplatin x 5 cycles. Last cycle omitted due to prolonged thrombocytopenia      06/19/2017 No evidence of disease     CT showed no evidence of recurrent disease.   CA 125 92.6. Re check at next visit         10/08/2017 -  Other     CA 31.3        Adenocarcinoma determined by biopsy of liver (CMS-HCC)    12/03/2015 Initial Diagnosis     Adenocarcinoma determined by biopsy of liver (CMS-HCC)      02/08/2018 -  Chemotherapy     Chemotherapy Treatment    Treatment Goal Palliative   Line of Treatment [No plan line of treatment]   Plan Name OP OVARIAN PACLITAXEL/CARBOPLATIN   Start Date 02/09/2018   End Date 07/01/2018 (Planned)   Provider Maurie Boettcher, MD   Chemotherapy dexamethasone (DECADRON) tablet 12 mg, 12 mg, Oral, Once, 3 of 6 cycles  Administration: 12 mg (02/09/2018), 12 mg (03/18/2018), 12 mg (04/29/2018)  CARBOplatin (PARAPLATIN) 414.5 mg in sodium chloride (NS) 0.9 % 250 mL IVPB, 414.5 mg (100 % of original dose 414.5 mg), Intravenous, Once, 3 of 6 cycles  Dose modification:   (original dose 414.5 mg, Cycle 1)  Administration: 414.5 mg (02/09/2018), 403 mg (03/18/2018), 372.5 mg (04/29/2018)  PACLItaxel (TAXOL) 220.5 mg in sodium chloride 0.9% NON-PVC (NS) 0.9 % 500 mL IVPB, 131.25 mg/m2 = 220.5 mg (75 % of original dose 175 mg/m2), Intravenous, Once, 2 of 2 cycles  Dose modification: 131.25 mg/m2 (original dose 175 mg/m2, Cycle 1, Reason: Dose Not Tolerated, Comment: Dose reduction of 25%)  Administration: 220.5 mg (02/09/2018)           Primary peritoneal adenocarcinoma (CMS-HCC)    01/03/2016 Initial Diagnosis     Robotic-assisted bilateral salpingo-oophorectomy with biopsy of peripancreatic mass and lysis of adhesions. IIIC primary peritoneal cancer      01/04/2016 - 06/18/2016 Chemotherapy     Paclitaxel/Carboplatin x 5. Several treatment delays related to thrombocytopenia, eliminated C6      07/11/2016 Remission     PET: no  active disease. Referral to hematology for prolonged thrombocytopenia. Bone bx recommended, however, procedure is being post-poned per family request.      11/25/2016 No evidence of disease     Normal gyn exam. Ca 125 24.8        02/24/2017 No evidence of disease     Normal exam. Ca 125, 65.7. Plan for CT at next visit as a follow up to rising Ca 125      10/23/2017 No evidence of disease     No clinical evidence of disease on exam. 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  -- Cystic lesion in the right thyroid lobe is unchanged in size, without appreciable increased FDG uptake relative to background thyroid. Further characterization with thyroid ultrasound is recommended if not already performed.             Malignant neoplasm of ovary, unspecified laterality (CMS-HCC)    02/08/2018 -  Chemotherapy     Chemotherapy Treatment    Treatment Goal Palliative   Line of Treatment [No plan line of treatment]   Plan Name OP OVARIAN PACLITAXEL/CARBOPLATIN   Start Date 02/09/2018   End Date 07/01/2018 (Planned)   Provider Maurie Boettcher, MD   Chemotherapy dexamethasone (DECADRON) tablet 12 mg, 12 mg, Oral, Once, 3 of 6 cycles  Administration: 12 mg (02/09/2018), 12 mg (03/18/2018), 12 mg (04/29/2018)  CARBOplatin (PARAPLATIN) 414.5 mg in sodium chloride (NS) 0.9 % 250 mL IVPB, 414.5 mg (100 % of original dose 414.5 mg), Intravenous, Once, 3 of 6 cycles  Dose modification:   (original dose 414.5 mg, Cycle 1)  Administration: 414.5 mg (02/09/2018), 403 mg (03/18/2018), 372.5 mg (04/29/2018)  PACLItaxel (TAXOL) 220.5 mg in sodium chloride 0.9% NON-PVC (NS) 0.9 % 500 mL IVPB, 131.25 mg/m2 = 220.5 mg (75 % of original dose 175 mg/m2), Intravenous, Once, 2 of 2 cycles  Dose modification: 131.25 mg/m2 (original dose 175 mg/m2, Cycle 1, Reason: Dose Not Tolerated, Comment: Dose reduction of 25%)  Administration: 220.5 mg (02/09/2018)         03/02/2018 Initial Diagnosis     Malignant neoplasm of ovary, unspecified laterality (CMS-HCC)         Past Medical History:   Diagnosis Date   ??? CHF (congestive heart failure) (CMS-HCC) 1996    history of due to medication   ??? Coronary artery disease    ??? Esophageal stricture     caused by esophageal ulcers   ??? Hyperlipemia    ??? Hypertension    ??? Hypoglycemia    ??? Hypothyroid     synthroid   ??? Melanoma (CMS-HCC)     removed   ??? Thoracic aneurysm without mention of rupture     ascending   ??? 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 ABDOMINAL HYSTERECTOMY      ?BSO   ??? APPENDECTOMY     ??? BUNIONECTOMY     ??? CARDIAC CATHETERIZATION     ??? CATARACT EXTRACTION, BILATERAL     ??? CHOLECYSTECTOMY     ??? LUMBAR SPINE SURGERY     ??? PANCREATIC CYST EXCISION  Pt states that a mass from the pancreas was removed.   ??? PR ENDOVASC TAA REINCL SUBCL N/A 08/09/2014    Procedure: TEVAR;  Surgeon: Suszanne Finch, MD;  Location: MAIN OR [facility];  Service: Vascular   ??? PR EXPLORATORY OF ABDOMEN N/A 12/03/2015    Procedure: Robotic Xi Exploratory Laparotomy, Exploratory Celiotomy With Or Without Biopsy(S);  Surgeon: Maurie Boettcher, MD;  Location: MAIN OR [facility];  Service: Gynecology Oncology   ??? PR INSERT ENDOVASC PROSTH, TAA N/A 08/09/2014    Procedure: PLACE PROXIMAL EXTENSION PROSTHESIS ENDOVASCULAR REPAIR DESCENDING THORACIC AORTA; INITIAL EXTENSION;  Surgeon: Suszanne Finch, MD;  Location: MAIN OR [facility];  Service: Vascular   ??? PR LAP, SURG ENTEROLYSIS N/A 12/03/2015    Procedure: Laparoscopy, Surgical, Enterolysis (Freeing Of Intestinal Adhesion) (Separate Procedure);  Surgeon: Maurie Boettcher, MD;  Location: MAIN OR [facility];  Service: Gynecology Oncology   ??? PR LAP,RMV  ADNEXAL STRUCTURE Bilateral 12/03/2015    Procedure: ROBOTIC XI LAPAROSCOPY, SURGICAL; W/REMOVAL OF ADNEXAL STRUCTURES (PARTIAL OR TOTAL OOPHORECTOMY &/OR SALPINGECTOMY);  Surgeon: Maurie Boettcher, MD;  Location: MAIN OR [facility];  Service: Gynecology Oncology   ??? PR UPGI ENDOSCOPY W/US FN BX N/A 10/19/2015    Procedure: UGI W/ TRANSENDOSCOPIC ULTRASOUND GUIDED INTRAMURAL/TRANSMURAL FINE NEEDLE ASPIRATION/BIOPSY(S), ESOPHAGUS;  Surgeon: Vonda Antigua, MD;  Location: [facility];  Service: Gastroenterology   ??? PR VISCER AND INFRARENAL ABDOM AORTA 4+ PROSTHESIS N/A 10/03/2014    Procedure: FEVAR;  Surgeon: Suszanne Finch, MD;  Location: MAIN OR [place];  Service: Vascular   ??? ROTATOR CUFF REPAIR Right    ??? SKIN CANCER EXCISION      melanoma   ??? STOMACH SURGERY  1980    benign growth        Family History   Problem Relation Age of Onset ??? Stroke Mother    ??? Cancer Father    ??? Cancer Sister    ??? Heart disease Sister    ??? Hypertension Sister    ??? COPD Brother    ??? Aneurysm Neg Hx    ??? Ovarian cancer Neg Hx    ??? Colon cancer Neg Hx    ??? Endometrial cancer Neg Hx       Family Status   Relation Name Status   ??? Mother  (Not Specified)   ??? Father  (Not Specified)   ??? Sister  (Not Specified)   ??? Brother  (Not Specified)   ??? Neg Hx  (Not Specified)     Social History     Occupational History   ??? Not on file   Tobacco Use   ??? Smoking status: Former Smoker     Packs/day: 1.00     Years: 10.00     Pack years: 10.00     Last attempt to quit: 03/22/1983     Years since quitting: 35.7   ??? Smokeless tobacco: Never Used   Substance and Sexual Activity   ??? Alcohol use: No   ??? Drug use: No   ??? Sexual activity: Not on file       The following portions of the patient's history were reviewed and updated as appropriate: allergies, current medications, past family history, past medical history, past social history, past surgical history and problem list.    Review of Systems   Constitutional: Negative for appetite change, fatigue and unexpected weight change.   Gastrointestinal: Negative for abdominal distention, abdominal pain, blood in stool, constipation, diarrhea, nausea, rectal pain  and vomiting.   Genitourinary: Negative for bladder incontinence, difficulty urinating, hematuria, nocturia, pelvic pain, vaginal bleeding and vaginal discharge.        Vital signs for this encounter:  BSA: 1.65 meters squared  BP 161/70  - Pulse 74  - Temp 36.4 ??C (97.5 ??F) (Oral)  - Wt 62.2 kg (137 lb 3.2 oz)  - SpO2 96%  - BMI 25.09 kg/m??     Physical Exam   Constitutional: She appears well-developed and well-nourished. She is cooperative.   Abdominal: Soft. Normal appearance. She exhibits no distension, no fluid wave, no ascites and no mass. There is no tenderness. There is no CVA tenderness. No hernia.   Genitourinary: There is no rash, tenderness or lesion on the right labia. There is no rash, tenderness or lesion on the left labia.    No vaginal discharge, erythema, tenderness or bleeding.   No erythema, tenderness or bleeding in the vagina.   Lymphadenopathy:        Right: No inguinal adenopathy present.        Left: No inguinal adenopathy present.   Neurological: She is alert.         Labs:    WBC   Date Value Ref Range Status   11/22/2018 5.1 3.4 - 10.8 x10E3/uL Final   01/30/2016 4.4  Final     HGB   Date Value Ref Range Status   11/22/2018 10.4 (L) 11.1 - 15.9 g/dL Final     Hgb, blood gas   Date Value Ref Range Status   10/03/2014 10.2 (L) 12.0 - 16.0 g/dL Final     HCT   Date Value Ref Range Status   11/22/2018 31.7 (L) 34.0 - 46.6 % Final     Platelet   Date Value Ref Range Status   11/22/2018 81 (LL) 150 - 450 x10E3/uL Final     Comment:     Actual platelet count may be somewhat higher than reported due to  aggregation of platelets in this sample.       Magnesium   Date Value Ref Range Status   07/02/2018 1.6 1.6 - 2.2 mg/dL Final   36/64/4034 1.7 mg/dL Final     Creatinine Whole Blood, POC   Date Value Ref Range Status   10/08/2017 0.8 0.7 - 1.1 mg/dL Final   74/25/9563 0.8 0.7 - 1.1 mg/dL Final     Comment:     Peformed by: [facility]  [address]  [place], [place] [address]       Creatinine, Serum   Date Value Ref Range Status   06/02/2016 0.71  Final     Creatinine   Date Value Ref Range Status   11/22/2018 0.78 0.57 - 1.00 mg/dL Final     AST   Date Value Ref Range Status   11/22/2018 27 0 - 40 IU/L Final     CA 125   Date Value Ref Range Status   10/01/2018 84.0 (H) 0.0 - 34.9 U/mL Final   07/02/2018 113.0 (H) 0.0 - 34.9 U/mL Final   04/29/2018 63.4 (H) 0.0 - 34.9 U/mL Final   03/18/2018 72.7 (H) 0.0 - 34.9 U/mL Final   01/29/2018 160.0 (H) 0.0 - 34.9 U/mL Final   10/23/2017 39.4 (H) 0.0 - 34.9 U/mL Final   10/08/2017 31.3 0.0 - 34.9 U/mL Final   06/19/2017 92.6 (H) 0.0 - 34.9 U/mL Final   02/24/2017 65.7 (H) 0.0 - 34.9 U/mL Final   11/25/2016 24.8  0.0 - 34.9 U/mL Final   10/23/2016 16.4 0.0 - 34.9 U/mL Final   07/11/2016 15.9 0.0 - 34.9 U/mL Final   06/19/2016 15.4 0.0 - 34.9 U/mL Final   05/06/2016 15.9 0.0 - 34.9 U/mL Final   04/17/2016 18.9 0.0 - 34.9 U/mL Final   02/08/2016 21.6 0.0 - 34.9 U/mL Final   01/01/2016 99.2 (H) 0.0 - 34.9 U/mL Final   11/06/2015 102.0 (H) 0.0 - 34.9 U/mL Final     CEA   Date Value Ref Range Status   10/01/2015 7.4 (H) 0.0 - 5.0 ng/mL Final     CA 19-9   Date Value Ref Range Status   10/01/2015 38.6 (H) 0 - 36.9 U/mL Final

## 2018-12-05 NOTE — Unmapped (Signed)
Specialty Medication Follow-up    Katherine Harper is a 85 y.o. female with recurrent ovarian cancer who I am seeing for follow up on their treatment with maintenance rucaparib.     Chemotherapy: Rucaparib   Start date: 10/27/18 - 250 mg PO BID                    09/28/18 - 300 mg PO BID                    08/31/18 - 300 mg QAM and 600 QPM                    07/23/18 - 600 mg PO BID    A/P:   1. Oral Chemotherapy:  CBC w/diff and CMP reviewed. Grade 1 anemia which is stable. Grade 1 thrombocytopenia which has improved. Although her platelets meet treatment parameters given the required dose reductions will transition to a different PARP inhibitor given potential increase in tolerability. Given hematologic toxicity with rucaparib will start olaparib at a reduced dose of 200 mg PO twice daily.   ?? DISCONTINUE rucaparib 250 mg PO BID  ?? Obtain access to olaparib therapy    Next follow up: Pending medication access    S/O: Katherine Harper presents to clinic for follow up with Dr. Ruthe Mannan. Overall she feels well when she is on the medication but she has been off oral PARP inhibitor therapy often due to low platelet count.     Medications reviewed and updated in EPIC? Yes    Missed doses: N/A    Labs  Infusion on 12/03/2018   Component Date Value Ref Range Status   ??? Sodium 12/03/2018 138  135 - 145 mmol/L Final   ??? Potassium 12/03/2018 4.1  3.5 - 5.0 mmol/L Final   ??? Chloride 12/03/2018 99  98 - 107 mmol/L Final   ??? Anion Gap 12/03/2018 8  7 - 15 mmol/L Final   ??? CO2 12/03/2018 31.0* 22.0 - 30.0 mmol/L Final   ??? BUN 12/03/2018 17  7 - 21 mg/dL Final   ??? Creatinine 12/03/2018 0.89  0.60 - 1.00 mg/dL Final   ??? BUN/Creatinine Ratio 12/03/2018 19   Final   ??? EGFR CKD-EPI Non-African American,* 12/03/2018 58* >=60 mL/min/1.55m2 Final   ??? EGFR CKD-EPI African American, Fem* 12/03/2018 67  >=60 mL/min/1.32m2 Final   ??? Glucose 12/03/2018 98  70 - 179 mg/dL Final   ??? Calcium 16/08/9603 9.4  8.5 - 10.2 mg/dL Final   ??? Albumin 54/07/8118 4.0  3.5 - 5.0 g/dL Final   ??? Total Protein 12/03/2018 7.0  6.5 - 8.3 g/dL Final   ??? Total Bilirubin 12/03/2018 0.5  0.0 - 1.2 mg/dL Final   ??? AST 14/78/2956 32  14 - 38 U/L Final   ??? ALT 12/03/2018 15  <35 U/L Final   ??? Alkaline Phosphatase 12/03/2018 72  38 - 126 U/L Final   ??? CA 125 12/03/2018 91.5* 0.0 - 34.9 U/mL Final   ??? WBC 12/03/2018 6.2  4.5 - 11.0 10*9/L Final   ??? RBC 12/03/2018 3.30* 4.00 - 5.20 10*12/L Final   ??? HGB 12/03/2018 10.4* 12.0 - 16.0 g/dL Final   ??? HCT 21/30/8657 33.2* 36.0 - 46.0 % Final   ??? MCV 12/03/2018 100.5* 80.0 - 100.0 fL Final   ??? MCH 12/03/2018 31.6  26.0 - 34.0 pg Final   ??? MCHC 12/03/2018 31.5  31.0 - 37.0 g/dL Final   ???  RDW 12/03/2018 15.3* 12.0 - 15.0 % Final   ??? MPV 12/03/2018 8.1  7.0 - 10.0 fL Final   ??? Platelet 12/03/2018 117* 150 - 440 10*9/L Final   ??? Neutrophils % 12/03/2018 67.2  % Final   ??? Lymphocytes % 12/03/2018 21.4  % Final   ??? Monocytes % 12/03/2018 7.9  % Final   ??? Eosinophils % 12/03/2018 1.3  % Final   ??? Basophils % 12/03/2018 0.4  % Final   ??? Absolute Neutrophils 12/03/2018 4.2  2.0 - 7.5 10*9/L Final   ??? Absolute Lymphocytes 12/03/2018 1.3* 1.5 - 5.0 10*9/L Final   ??? Absolute Monocytes 12/03/2018 0.5  0.2 - 0.8 10*9/L Final   ??? Absolute Eosinophils 12/03/2018 0.1  0.0 - 0.4 10*9/L Final   ??? Absolute Basophils 12/03/2018 0.0  0.0 - 0.1 10*9/L Final   ??? Large Unstained Cells 12/03/2018 2  0 - 4 % Final   ??? Macrocytosis 12/03/2018 Moderate* Not Present Final   ??? Hypochromasia 12/03/2018 Slight* Not Present Final     _____________________________________________________________    Oral Chemotherapy Management Program  Olaparib New Start     Oral Chemotherapy: Olaparib 200 mg (2 tablets) by mouth twice daily  Phase: Maintenance  Expected start date: TBD (pending med access)  Referring physician: Dr. Stark Jock    A/P:   The following information was reviewed with the patient and their caregivers:    1. Safe handling: This is an oral medication and the schedule will be provided by the cancer care team. Do not share your medicine with others. Keep the medicine in the original container with a child-proof top. Only you should handle this medicine or your caregiver if the caregiver is wearing gloves.    2. Food/drug Considerations: Olaparib can be taken with or without food. Swallow the capsule whole and do not break, open, or chew.      3. Side effects: Anemia, thrombocytopenia, neutropenia, fatigue, arthralgia, nausea, vomiting, diarrhea, constipation, decreased appetite, dyspepsia, and dysgeusia are some of the more common side effects.     4. Drug Drug Interactions: Olaparib is a major substrate of CYP3A4 and should not be used in combination with certain other medicines. If being used with a moderate or strong CYP3A4 inhibitor this medication should be prescribed at a reduced dose. Encouraged to discuss all new medicines with your oncologist or pharmacist before taking them including over the counter medicines, vitamins, herbal supplements or neutraceuticals.      5. Reproductive Risk: Olaparib is considered toxic to the embryo or fetus. Effective contraceptive should be used during treatment and for 6 months following the completion of therapy.     6. Bone marrow dysfunction: A rare but serious side effect is bone marrow dysfunction resulting in acute myeloid leukemia or myelodysplastic syndrome. If bone marrow dysfunction       occurs it would require a change in treatment.       I spent approximately 20 minutes counseling the patient.     Hermelinda Medicus, PharmD, BCOP, CPP  Gynecologic Oncology Clinic Pharmacist  Pager: [phone]

## 2018-12-16 NOTE — Unmapped (Signed)
I spoke with Tish from [employer] at [phone] ext [phone] regarding the patient's recent life insurance paperwork.  Questions answered regarding attending physician (Dr. Pattricia Boss), and confirming dates of admission on form.

## 2018-12-18 NOTE — Unmapped (Signed)
[facility] Medication Referral: Appeal Approved      Medication (Brand/Generic): [place]    Final Test Claim completed with resulted information below:    Patient ABLE to fill at [facility]  Insurance Company:  [employer]  Anticipated Copay: $0  Is anticipated copay with a copay card or grant? Yes    Does patient's insurance plan only allow a 15 day supply for the first 6 fills in the Split Fill Program? No  If yes, inform patient they can request to dis-enroll from the [facility] by calling the patient help desk at N/A.      If the copay is under the $25 defined limit, per policy there will be no further investigation of need for financial assistance at this time unless patient requests. This referral has been communicated to the provider and handed off to the [facility] team for further processing and filling of prescribed medication.   ______________________________________________________________________  Please utilize this referral for viewing purposes as it will serve as the central location for all relevant documentation and updates.

## 2018-12-23 MED FILL — LYNPARZA 100 MG TABLET: 30 days supply | Qty: 120 | Fill #0 | Status: AC

## 2018-12-26 NOTE — Unmapped (Signed)
Specialty Medication Follow-up    Katherine Harper is a 85 y.o. female with recurrent ovarian cancer who I am seeing for follow up on their treatment with olaparib maintenance therapy.     Chemotherapy: Olaparib 200 mg PO BID  Start date: 12/24/18    A/P:   1. Oral Chemotherapy: CBC w/diff and CMP reviewed. Grade 1 anemia and grade 1 thrombocytopenia both of which are at baseline. No grade 3 toxicities. Patient meets treatment parameters to start olaparib maintenance therapy.   ?? Start olaparib 200 mg PO BID  ?? Obtain CBC w/diff in 2 weeks    I spent approximately 10 minutes in direct patient care.    Next follow up: In 2 weeks with lab results    Referring physician: Dr. Valla Leaver, PharmD, BCOP, CPP  Gynecologic Oncology Clinic Pharmacist  Pager: [phone]    S/O: Katherine Harper was contacted regarding medication access to olaparib therapy. She reports no new adverse effects and is ready to start olaparib therapy. Administration instructions, pharmacy information, copay, and lab follow up were discussed with the patient. All questions were answered at the time of the visit.     Specialty Pharmacy Information:  - Reviewed the services that the [facility] will provide and how to contact them [phone] option [phone]).  A representative from the pharmacy will contact the patient to remind them to set up their refill 7-10 days prior to the patient running out of medication.  Emphasized the importance of answering or returning phone calls from the [facility] to prevent delays in therapy.  The pharmacy MUST speak to the patient to schedule the refill.  - The pharmacist is available Monday through Friday 8:30am to 4:30pm.   - A pharmacist is available via pager 24/7 to answer any clinical questions you may have regarding your medication.  - Patient will receive a medication information handout and a [facility] Welcome Packet with their shipment.  - Your shipment will be delivered on 12/24/18 via UPS. and you  will not have to sign for the package.      Medications reviewed and updated in EPIC? no    Missed doses: N/A    Labs  No visits with results within 1 Day(s) from this visit.   Latest known visit with results is:   Infusion on 12/03/2018   Component Date Value Ref Range Status   ??? Sodium 12/03/2018 138  135 - 145 mmol/L Final   ??? Potassium 12/03/2018 4.1  3.5 - 5.0 mmol/L Final   ??? Chloride 12/03/2018 99  98 - 107 mmol/L Final   ??? Anion Gap 12/03/2018 8  7 - 15 mmol/L Final   ??? CO2 12/03/2018 31.0* 22.0 - 30.0 mmol/L Final   ??? BUN 12/03/2018 17  7 - 21 mg/dL Final   ??? Creatinine 12/03/2018 0.89  0.60 - 1.00 mg/dL Final   ??? BUN/Creatinine Ratio 12/03/2018 19   Final   ??? EGFR CKD-EPI Non-African American,* 12/03/2018 58* >=60 mL/min/1.72m2 Final   ??? EGFR CKD-EPI African American, Fem* 12/03/2018 67  >=60 mL/min/1.57m2 Final   ??? Glucose 12/03/2018 98  70 - 179 mg/dL Final   ??? Calcium 47/82/9562 9.4  8.5 - 10.2 mg/dL Final   ??? Albumin 13/06/6577 4.0  3.5 - 5.0 g/dL Final   ??? Total Protein 12/03/2018 7.0  6.5 - 8.3 g/dL Final   ??? Total Bilirubin 12/03/2018 0.5  0.0 - 1.2 mg/dL Final   ???  AST 12/03/2018 32  14 - 38 U/L Final   ??? ALT 12/03/2018 15  <35 U/L Final   ??? Alkaline Phosphatase 12/03/2018 72  38 - 126 U/L Final   ??? CA 125 12/03/2018 91.5* 0.0 - 34.9 U/mL Final   ??? WBC 12/03/2018 6.2  4.5 - 11.0 10*9/L Final   ??? RBC 12/03/2018 3.30* 4.00 - 5.20 10*12/L Final   ??? HGB 12/03/2018 10.4* 12.0 - 16.0 g/dL Final   ??? HCT 82/95/6213 33.2* 36.0 - 46.0 % Final   ??? MCV 12/03/2018 100.5* 80.0 - 100.0 fL Final   ??? MCH 12/03/2018 31.6  26.0 - 34.0 pg Final   ??? MCHC 12/03/2018 31.5  31.0 - 37.0 g/dL Final   ??? RDW 08/65/7846 15.3* 12.0 - 15.0 % Final   ??? MPV 12/03/2018 8.1  7.0 - 10.0 fL Final   ??? Platelet 12/03/2018 117* 150 - 440 10*9/L Final   ??? Neutrophils % 12/03/2018 67.2  % Final   ??? Lymphocytes % 12/03/2018 21.4  % Final   ??? Monocytes % 12/03/2018 7.9  % Final   ??? Eosinophils % 12/03/2018 1.3  % Final   ??? Basophils % 12/03/2018 0.4  % Final   ??? Absolute Neutrophils 12/03/2018 4.2  2.0 - 7.5 10*9/L Final   ??? Absolute Lymphocytes 12/03/2018 1.3* 1.5 - 5.0 10*9/L Final   ??? Absolute Monocytes 12/03/2018 0.5  0.2 - 0.8 10*9/L Final   ??? Absolute Eosinophils 12/03/2018 0.1  0.0 - 0.4 10*9/L Final   ??? Absolute Basophils 12/03/2018 0.0  0.0 - 0.1 10*9/L Final   ??? Large Unstained Cells 12/03/2018 2  0 - 4 % Final   ??? Macrocytosis 12/03/2018 Moderate* Not Present Final   ??? Hypochromasia 12/03/2018 Slight* Not Present Final

## 2019-01-10 DIAGNOSIS — C569 Malignant neoplasm of unspecified ovary: Principal | ICD-10-CM

## 2019-01-11 DIAGNOSIS — C569 Malignant neoplasm of unspecified ovary: Principal | ICD-10-CM

## 2019-01-11 LAB — CBC W/ DIFFERENTIAL
BANDED NEUTROPHILS ABSOLUTE COUNT: 0 10*3/uL (ref 0.0–0.1)
BASOPHILS ABSOLUTE COUNT: 0 10*3/uL (ref 0.0–0.2)
BASOPHILS RELATIVE PERCENT: 1 %
EOSINOPHILS ABSOLUTE COUNT: 0.1 10*3/uL (ref 0.0–0.4)
EOSINOPHILS RELATIVE PERCENT: 1 %
HEMATOCRIT: 31.4 % — ABNORMAL LOW (ref 34.0–46.6)
HEMOGLOBIN: 10.2 g/dL — ABNORMAL LOW (ref 11.1–15.9)
IMMATURE GRANULOCYTES: 0 %
LYMPHOCYTES ABSOLUTE COUNT: 1.5 10*3/uL (ref 0.7–3.1)
LYMPHOCYTES RELATIVE PERCENT: 33 %
MEAN CORPUSCULAR HEMOGLOBIN: 31.6 pg (ref 26.6–33.0)
MEAN CORPUSCULAR VOLUME: 97 fL (ref 79–97)
MONOCYTES ABSOLUTE COUNT: 0.5 10*3/uL (ref 0.1–0.9)
MONOCYTES RELATIVE PERCENT: 10 %
NEUTROPHILS ABSOLUTE COUNT: 2.6 10*3/uL (ref 1.4–7.0)
NEUTROPHILS RELATIVE PERCENT: 55 %
PLATELET COUNT: 85 10*3/uL — CL (ref 150–450)
RED BLOOD CELL COUNT: 3.23 x10E6/uL — ABNORMAL LOW (ref 3.77–5.28)
RED CELL DISTRIBUTION WIDTH: 13.8 % (ref 11.7–15.4)
WHITE BLOOD CELL COUNT: 4.7 10*3/uL (ref 3.4–10.8)

## 2019-01-11 MED ORDER — ONDANSETRON HCL 8 MG TABLET
ORAL_TABLET | Freq: Three times a day (TID) | ORAL | 2 refills | 0.00000 days | Status: CP | PRN
Start: 2019-01-11 — End: 2019-03-22

## 2019-01-14 NOTE — Unmapped (Signed)
Specialty Medication Follow-up    Katherine Harper is a 85 y.o. female with recurrent ovarian cancer who I am seeing for follow up on their treatment with olaparib maintenance therapy.     Chemotherapy: Olaparib   Start date: 01/11/19 - 100 mg PO BID                    12/24/18 - 200 mg PO BID    A/P:   1. Oral Chemotherapy: CBC w/diff reviewed. Grade 1 anemia and grade 1 thrombocytopenia both of which have worsened. Grade 1 fatigue and grade 1 nausea both of which have improved. Given multitude of toxicities will pre-emptively decrease olaparib to 100 mg PO BID. Will repeat labs in 1 week.   ?? Decrease olaparib 100 mg PO BID  ?? Obtain CBC w/diff in 1 weeks    I spent approximately 5 minutes in direct patient care.    Next follow up: In 1 week with lab results    Referring physician: Dr. Valla Leaver, PharmD, BCOP, CPP  Gynecologic Oncology Clinic Pharmacist  Pager: [phone]    S/O:  [name] daughter, Katherine Harper, was contacted regarding recent lab results. She reports that overall her mother has felt better but still endorses fatigue and nausea. For nausea she is taking ondansetron with helps to alleviate the nausea.       Medications reviewed and updated in EPIC? no    Missed doses: ----    Labs  No visits with results within 1 Day(s) from this visit.   Latest known visit with results is:   Ancillary Orders on 01/10/2019   Component Date Value Ref Range Status   ??? WBC 01/10/2019 4.7  3.4 - 10.8 x10E3/uL Final   ??? RBC 01/10/2019 3.23* 3.77 - 5.28 x10E6/uL Final   ??? HGB 01/10/2019 10.2* 11.1 - 15.9 g/dL Final   ??? HCT 45/40/9811 31.4* 34.0 - 46.6 % Final   ??? MCV 01/10/2019 97  79 - 97 fL Final   ??? MCH 01/10/2019 31.6  26.6 - 33.0 pg Final   ??? MCHC 01/10/2019 32.5  31.5 - 35.7 g/dL Final   ??? RDW 91/47/8295 13.8  11.7 - 15.4 % Final   ??? Platelet 01/10/2019 85* 150 - 450 x10E3/uL Final   ??? Neutrophils % 01/10/2019 55  Not Estab. % Final   ??? Lymphocytes % 01/10/2019 33  Not Estab. % Final   ??? Monocytes % 01/10/2019 10  Not Estab. % Final   ??? Eosinophils % 01/10/2019 1  Not Estab. % Final   ??? Basophils % 01/10/2019 1  Not Estab. % Final   ??? Absolute Neutrophils 01/10/2019 2.6  1.4 - 7.0 x10E3/uL Final   ??? Absolute Lymphocytes 01/10/2019 1.5  0.7 - 3.1 x10E3/uL Final   ??? Absolute Monocytes  01/10/2019 0.5  0.1 - 0.9 x10E3/uL Final   ??? Absolute Eosinophils 01/10/2019 0.1  0.0 - 0.4 x10E3/uL Final   ??? Absolute Basophils  01/10/2019 0.0  0.0 - 0.2 x10E3/uL Final   ??? Immature Granulocytes 01/10/2019 0  Not Estab. % Final   ??? Bands Absolute 01/10/2019 0.0  0.0 - 0.1 x10E3/uL Final   ??? Hematology Comments: 01/10/2019 Note:   Final    Verified by microscopic examination.

## 2019-01-17 DIAGNOSIS — C569 Malignant neoplasm of unspecified ovary: Principal | ICD-10-CM

## 2019-01-24 DIAGNOSIS — C569 Malignant neoplasm of unspecified ovary: Principal | ICD-10-CM

## 2019-01-24 NOTE — Unmapped (Signed)
Specialty Medication Follow-up    Katherine Harper is a 85 y.o. female with recurrent ovarian cancer who I am seeing for follow up on their treatment with olaparib maintenance therapy.     Chemotherapy: Olaparib   Start date: 01/11/19 - 100 mg PO BID                    12/24/18 - 200 mg PO BID    A/P:   1. Oral Chemotherapy: CBC w/diff and CMP reviewed. Grade 2 anemia and grade 1 serum creatinine elevation both of which have worsened. Grade 1 thrombocytopenia which is stable. No grade 3 toxicities therefore will continue at current dose intensity. Given that platelets are still close to the threshold of holding olaparib therapy will repeat labs in 1 week.   ?? Continue olaparib 100 mg PO BID  ?? Obtain CBC w/diff in 1 weeks    I spent approximately 10 minutes in direct patient care.    Next follow up: In 1 week with lab results    Referring physician: Dr. Valla Leaver, PharmD, BCOP, CPP  Gynecologic Oncology Clinic Pharmacist  Pager: [phone]    S/O:  Katherine Harper daughter, Katherine Harper, was contacted regarding recent lab results. She reports that overall she continues to feel the same with no new adverse effects.     Medications reviewed and updated in EPIC? no    Missed doses: ----    Labs (01/17/19)  WBC 4.4 x10*9/L   Hgb 9.7 g/dL   Plt 85 X91*4/N   ANC 2.2 x10*9/L   SCr 1.01 mg/dL   TBili 0.4 mg/dL   AST 26 U/L   ALT 14 U/L   Alk Phos 71 U/L

## 2019-01-25 LAB — CBC W/ DIFFERENTIAL
BASOPHILS ABSOLUTE COUNT: 0.1 10*3/uL (ref 0.0–0.2)
BASOPHILS RELATIVE PERCENT: 1 %
EOSINOPHILS ABSOLUTE COUNT: 0.1 10*3/uL (ref 0.0–0.4)
EOSINOPHILS RELATIVE PERCENT: 2 %
HEMATOCRIT: 31.2 % — ABNORMAL LOW (ref 34.0–46.6)
HEMOGLOBIN: 10.2 g/dL — ABNORMAL LOW (ref 11.1–15.9)
IMMATURE GRANULOCYTES: 0 %
LYMPHOCYTES ABSOLUTE COUNT: 1.3 10*3/uL (ref 0.7–3.1)
LYMPHOCYTES RELATIVE PERCENT: 26 %
MEAN CORPUSCULAR HEMOGLOBIN CONC: 32.7 g/dL (ref 31.5–35.7)
MEAN CORPUSCULAR HEMOGLOBIN: 32.5 pg (ref 26.6–33.0)
MEAN CORPUSCULAR VOLUME: 99 fL — ABNORMAL HIGH (ref 79–97)
MONOCYTES ABSOLUTE COUNT: 0.5 10*3/uL (ref 0.1–0.9)
MONOCYTES RELATIVE PERCENT: 10 %
NEUTROPHILS ABSOLUTE COUNT: 3.1 10*3/uL (ref 1.4–7.0)
NEUTROPHILS RELATIVE PERCENT: 61 %
PLATELET COUNT: 79 10*3/uL — CL (ref 150–450)
PLATELET COUNT: 79 x10E3/uL — CL (ref 150–450)
RED CELL DISTRIBUTION WIDTH: 15.8 % — ABNORMAL HIGH (ref 11.7–15.4)
WHITE BLOOD CELL COUNT: 5.1 10*3/uL (ref 3.4–10.8)

## 2019-01-27 NOTE — Unmapped (Signed)
Specialty Medication Follow-up    Katherine Harper is a 85 y.o. female with recurrent ovarian cancer who I am seeing for follow up on their treatment with olaparib maintenance therapy.     Chemotherapy: Olaparib   Start date: 01/11/19 - 100 mg PO BID                    12/24/18 - 200 mg PO BID    A/P:   1. Oral Chemotherapy: CBC w/diff reviewed. Grade 1 anemia which is stable. Grade 1 thrombocytopenia which is stable. No grade 3 toxicities therefore will continue at current dose intensity. Although labs are stable given the platelets close to the threshold of 75 will continue to monitor blood counts closely. Will repeat labs in 2 weeks.   ?? Continue olaparib 100 mg PO BID  ?? Obtain CBC w/diff in 2 weeks    I spent approximately 5 minutes in direct patient care.    Next follow up: In 2 weeks with lab results    Referring physician: Dr. Valla Leaver, PharmD, BCOP, CPP  Gynecologic Oncology Clinic Pharmacist  Pager: [phone]    S/O:  Katherine Harper daughter, Katherine Harper, was contacted regarding recent lab results. She states that overall her mother continues to feel well with no new complaints.     Medications reviewed and updated in EPIC? no    Missed doses: ----    Labs  No visits with results within 1 Day(s) from this visit.   Latest known visit with results is:   Ancillary Orders on 01/24/2019   Component Date Value Ref Range Status   ??? WBC 01/24/2019 5.1  3.4 - 10.8 x10E3/uL Final   ??? RBC 01/24/2019 3.14* 3.77 - 5.28 x10E6/uL Final    Rare nucleated RBC seen.   ??? HGB 01/24/2019 10.2* 11.1 - 15.9 g/dL Final   ??? HCT 45/40/9811 31.2* 34.0 - 46.6 % Final   ??? MCV 01/24/2019 99* 79 - 97 fL Final   ??? MCH 01/24/2019 32.5  26.6 - 33.0 pg Final   ??? MCHC 01/24/2019 32.7  31.5 - 35.7 g/dL Final   ??? RDW 91/47/8295 15.8* 11.7 - 15.4 % Final   ??? Platelet 01/24/2019 79* 150 - 450 x10E3/uL Final    Comment: Actual platelet count may be somewhat higher than reported due to  aggregation of platelets in this sample. ??? Neutrophils % 01/24/2019 61  Not Estab. % Final   ??? Lymphocytes % 01/24/2019 26  Not Estab. % Final   ??? Monocytes % 01/24/2019 10  Not Estab. % Final   ??? Eosinophils % 01/24/2019 2  Not Estab. % Final   ??? Basophils % 01/24/2019 1  Not Estab. % Final   ??? Absolute Neutrophils 01/24/2019 3.1  1.4 - 7.0 x10E3/uL Final   ??? Absolute Lymphocytes 01/24/2019 1.3  0.7 - 3.1 x10E3/uL Final   ??? Absolute Monocytes  01/24/2019 0.5  0.1 - 0.9 x10E3/uL Final   ??? Absolute Eosinophils 01/24/2019 0.1  0.0 - 0.4 x10E3/uL Final   ??? Absolute Basophils  01/24/2019 0.1  0.0 - 0.2 x10E3/uL Final   ??? Immature Granulocytes 01/24/2019 0  Not Estab. % Final   ??? Bands Absolute 01/24/2019 0.0  0.0 - 0.1 x10E3/uL Final   ??? Hematology Comments: 01/24/2019 Note:   Final    Verified by microscopic examination.

## 2019-01-31 DIAGNOSIS — C569 Malignant neoplasm of unspecified ovary: Principal | ICD-10-CM

## 2019-02-07 DIAGNOSIS — C569 Malignant neoplasm of unspecified ovary: Principal | ICD-10-CM

## 2019-02-07 MED ORDER — OLAPARIB 100 MG TABLET
ORAL_CAPSULE | Freq: Two times a day (BID) | ORAL | 5 refills | 0 days | Status: CP
Start: 2019-02-07 — End: ?

## 2019-02-09 LAB — CBC W/ DIFFERENTIAL
BANDED NEUTROPHILS ABSOLUTE COUNT: 0 10*3/uL (ref 0.0–0.1)
BASOPHILS ABSOLUTE COUNT: 0.1 10*3/uL (ref 0.0–0.2)
BASOPHILS RELATIVE PERCENT: 1 %
EOSINOPHILS ABSOLUTE COUNT: 0.1 10*3/uL (ref 0.0–0.4)
EOSINOPHILS RELATIVE PERCENT: 2 %
HEMATOCRIT: 28.8 % — ABNORMAL LOW (ref 34.0–46.6)
HEMOGLOBIN: 9.5 g/dL — ABNORMAL LOW (ref 11.1–15.9)
IMMATURE GRANULOCYTES: 0 %
LYMPHOCYTES ABSOLUTE COUNT: 1.4 10*3/uL (ref 0.7–3.1)
LYMPHOCYTES RELATIVE PERCENT: 25 %
MEAN CORPUSCULAR HEMOGLOBIN CONC: 33 g/dL (ref 31.5–35.7)
MEAN CORPUSCULAR HEMOGLOBIN: 32.8 pg (ref 26.6–33.0)
MEAN CORPUSCULAR VOLUME: 99 fL — ABNORMAL HIGH (ref 79–97)
MONOCYTES ABSOLUTE COUNT: 0.6 10*3/uL (ref 0.1–0.9)
NEUTROPHILS RELATIVE PERCENT: 62 %
PLATELET COUNT: 103 10*3/uL — ABNORMAL LOW (ref 150–450)
RED BLOOD CELL COUNT: 2.9 x10E6/uL — ABNORMAL LOW (ref 3.77–5.28)
RED CELL DISTRIBUTION WIDTH: 16.9 % — ABNORMAL HIGH (ref 11.7–15.4)
WHITE BLOOD CELL COUNT: 5.7 10*3/uL (ref 3.4–10.8)

## 2019-02-11 NOTE — Unmapped (Signed)
Specialty Medication Follow-up    Katherine Harper is a 85 y.o. female with recurrent ovarian cancer who I am seeing for follow up on their treatment with olaparib maintenance therapy.     Chemotherapy: Olaparib   Start date: 01/11/19 - 100 mg PO BID                    12/24/18 - 200 mg PO BID    A/P:   1. Oral Chemotherapy: CBC w/diff reviewed. Grade 1 anemia which has worsened. Grade 1 thrombocytopenia which has improved. No grade 3 toxicities therefore will continue with current dose intensity. Platelets have finally stabilized at current dose therefore will space out labs to every 4 weeks. Repeat labs in 4 weeks.   ?? Continue olaparib 100 mg PO BID  ?? Obtain CBC w/diff and CMP in 4 weeks    I spent approximately 10 minutes in direct patient care.    Next follow up: In 4 weeks with lab results    Referring physician: Dr. Valla Leaver, PharmD, BCOP, CPP  Gynecologic Oncology Clinic Pharmacist  Pager: [phone]    S/O:  Ms. Prowell daughter was contacted regarding recent lab results. She endorses that overall she continues to feel well.     Medications reviewed and updated in EPIC? no    Missed doses: ----    Labs  No visits with results within 1 Day(s) from this visit.   Latest known visit with results is:   Ancillary Orders on 01/31/2019   Component Date Value Ref Range Status   ??? WBC 02/08/2019 5.7  3.4 - 10.8 x10E3/uL Final   ??? RBC 02/08/2019 2.90* 3.77 - 5.28 x10E6/uL Final   ??? HGB 02/08/2019 9.5* 11.1 - 15.9 g/dL Final   ??? HCT 84/69/6295 28.8* 34.0 - 46.6 % Final   ??? MCV 02/08/2019 99* 79 - 97 fL Final   ??? MCH 02/08/2019 32.8  26.6 - 33.0 pg Final   ??? MCHC 02/08/2019 33.0  31.5 - 35.7 g/dL Final   ??? RDW 28/41/3244 16.9* 11.7 - 15.4 % Final   ??? Platelet 02/08/2019 103* 150 - 450 x10E3/uL Final   ??? Neutrophils % 02/08/2019 62  Not Estab. % Final   ??? Lymphocytes % 02/08/2019 25  Not Estab. % Final   ??? Monocytes % 02/08/2019 10  Not Estab. % Final   ??? Eosinophils % 02/08/2019 2  Not Estab. % Final   ??? Basophils % 02/08/2019 1  Not Estab. % Final   ??? Absolute Neutrophils 02/08/2019 3.5  1.4 - 7.0 x10E3/uL Final   ??? Absolute Lymphocytes 02/08/2019 1.4  0.7 - 3.1 x10E3/uL Final   ??? Absolute Monocytes  02/08/2019 0.6  0.1 - 0.9 x10E3/uL Final   ??? Absolute Eosinophils 02/08/2019 0.1  0.0 - 0.4 x10E3/uL Final   ??? Absolute Basophils  02/08/2019 0.1  0.0 - 0.2 x10E3/uL Final   ??? Immature Granulocytes 02/08/2019 0  Not Estab. % Final   ??? Bands Absolute 02/08/2019 0.0  0.0 - 0.1 x10E3/uL Final

## 2019-03-03 NOTE — Unmapped (Signed)
This patient has been disenrolled from the [facility] specialty pharmacy services due to a pharmacy change. The patient is getting the medication directly from the manufacturer.    Katherine Harper  [facility] Specialty Pharmacist

## 2019-03-22 MED ORDER — ONDANSETRON HCL 8 MG TABLET
ORAL_TABLET | Freq: Three times a day (TID) | ORAL | 5 refills | 0 days | Status: CP | PRN
Start: 2019-03-22 — End: 2020-03-21

## 2019-05-20 ENCOUNTER — Institutional Professional Consult (permissible substitution)
Admit: 2019-05-20 | Discharge: 2019-05-21 | Payer: MEDICARE | Attending: Gynecologic Oncology | Primary: Gynecologic Oncology

## 2019-07-18 DIAGNOSIS — C569 Malignant neoplasm of unspecified ovary: Secondary | ICD-10-CM

## 2019-08-01 DIAGNOSIS — C569 Malignant neoplasm of unspecified ovary: Secondary | ICD-10-CM

## 2019-08-05 ENCOUNTER — Ambulatory Visit
Admit: 2019-08-05 | Discharge: 2019-08-06 | Payer: MEDICARE | Attending: Gynecologic Oncology | Primary: Gynecologic Oncology

## 2019-08-05 DIAGNOSIS — C482 Malignant neoplasm of peritoneum, unspecified: Secondary | ICD-10-CM

## 2019-08-09 DIAGNOSIS — C801 Malignant (primary) neoplasm, unspecified: Secondary | ICD-10-CM

## 2019-08-23 DIAGNOSIS — C569 Malignant neoplasm of unspecified ovary: Principal | ICD-10-CM

## 2019-08-24 MED ORDER — OLAPARIB 100 MG TABLET: 100 mg | capsule | Freq: Two times a day (BID) | 5 refills | 30 days | Status: AC

## 2019-08-26 DIAGNOSIS — C569 Malignant neoplasm of unspecified ovary: Principal | ICD-10-CM

## 2019-08-29 DIAGNOSIS — C569 Malignant neoplasm of unspecified ovary: Principal | ICD-10-CM

## 2019-10-24 DIAGNOSIS — C482 Malignant neoplasm of peritoneum, unspecified: Principal | ICD-10-CM

## 2019-11-01 DIAGNOSIS — I716 Thoracoabdominal aortic aneurysm, without rupture: Principal | ICD-10-CM

## 2019-11-07 DIAGNOSIS — C482 Malignant neoplasm of peritoneum, unspecified: Principal | ICD-10-CM

## 2019-11-14 DIAGNOSIS — C482 Malignant neoplasm of peritoneum, unspecified: Principal | ICD-10-CM

## 2019-11-21 DIAGNOSIS — C482 Malignant neoplasm of peritoneum, unspecified: Principal | ICD-10-CM

## 2019-12-05 DIAGNOSIS — C482 Malignant neoplasm of peritoneum, unspecified: Principal | ICD-10-CM

## 2019-12-06 DIAGNOSIS — C569 Malignant neoplasm of unspecified ovary: Principal | ICD-10-CM

## 2019-12-06 MED ORDER — PROCHLORPERAZINE MALEATE 10 MG TABLET
ORAL_TABLET | Freq: Four times a day (QID) | ORAL | 2 refills | 15.00000 days | Status: CP | PRN
Start: 2019-12-06 — End: ?

## 2019-12-12 DIAGNOSIS — I716 Thoracoabdominal aortic aneurysm, without rupture: Principal | ICD-10-CM

## 2019-12-15 ENCOUNTER — Ambulatory Visit: Admit: 2019-12-15 | Discharge: 2019-12-15 | Payer: MEDICARE

## 2019-12-15 ENCOUNTER — Ambulatory Visit: Admit: 2019-12-15 | Discharge: 2019-12-15 | Payer: MEDICARE | Attending: Vascular Surgery | Primary: Vascular Surgery

## 2019-12-15 ENCOUNTER — Other Ambulatory Visit: Admit: 2019-12-15 | Discharge: 2019-12-15 | Payer: MEDICARE

## 2019-12-15 DIAGNOSIS — I716 Thoracoabdominal aortic aneurysm, without rupture: Principal | ICD-10-CM

## 2019-12-19 DIAGNOSIS — C482 Malignant neoplasm of peritoneum, unspecified: Principal | ICD-10-CM

## 2019-12-23 ENCOUNTER — Ambulatory Visit
Admit: 2019-12-23 | Discharge: 2019-12-23 | Payer: MEDICARE | Attending: Gynecologic Oncology | Primary: Gynecologic Oncology

## 2019-12-23 ENCOUNTER — Other Ambulatory Visit: Admit: 2019-12-23 | Discharge: 2019-12-23 | Payer: MEDICARE

## 2019-12-23 DIAGNOSIS — C482 Malignant neoplasm of peritoneum, unspecified: Principal | ICD-10-CM

## 2019-12-23 DIAGNOSIS — C569 Malignant neoplasm of unspecified ovary: Principal | ICD-10-CM

## 2019-12-23 DIAGNOSIS — R978 Other abnormal tumor markers: Principal | ICD-10-CM

## 2019-12-23 MED ORDER — CHOLESTYRAMINE (WITH SUGAR) 4 GRAM POWDER FOR SUSP IN A PACKET
Freq: Four times a day (QID) | ORAL | 0 refills | 23.00000 days | Status: CP | PRN
Start: 2019-12-23 — End: 2020-12-22

## 2019-12-27 ENCOUNTER — Telehealth: Admit: 2019-12-27 | Discharge: 2019-12-28 | Payer: MEDICARE | Attending: Family | Primary: Family

## 2019-12-27 DIAGNOSIS — K8689 Other specified diseases of pancreas: Principal | ICD-10-CM

## 2019-12-27 DIAGNOSIS — K831 Obstruction of bile duct: Principal | ICD-10-CM

## 2020-01-02 DIAGNOSIS — C482 Malignant neoplasm of peritoneum, unspecified: Principal | ICD-10-CM

## 2020-01-03 ENCOUNTER — Encounter
Admit: 2020-01-03 | Discharge: 2020-01-03 | Payer: MEDICARE | Attending: Nurse Practitioner | Primary: Nurse Practitioner

## 2020-01-03 ENCOUNTER — Ambulatory Visit: Admit: 2020-01-03 | Discharge: 2020-01-03 | Payer: MEDICARE

## 2020-01-03 MED ORDER — DOXEPIN 25 MG CAPSULE
ORAL_CAPSULE | Freq: Every evening | ORAL | 0 refills | 10.00000 days | Status: CP
Start: 2020-01-03 — End: 2020-01-13

## 2020-01-16 DIAGNOSIS — C482 Malignant neoplasm of peritoneum, unspecified: Principal | ICD-10-CM

## 2020-01-17 MED ORDER — TRAMADOL 50 MG TABLET
ORAL_TABLET | Freq: Four times a day (QID) | ORAL | 0 refills | 5 days | Status: CP | PRN
Start: 2020-01-17 — End: 2021-01-16

## 2020-01-18 MED ORDER — OXYCODONE-ACETAMINOPHEN 5 MG-325 MG TABLET
ORAL_TABLET | ORAL | 0 refills | 5.00000 days | Status: CP | PRN
Start: 2020-01-18 — End: 2020-01-23

## 2020-01-20 ENCOUNTER — Institutional Professional Consult (permissible substitution)
Admit: 2020-01-20 | Discharge: 2020-01-21 | Payer: MEDICARE | Attending: Gynecologic Oncology | Primary: Gynecologic Oncology

## 2020-01-23 DIAGNOSIS — Z515 Encounter for palliative care: Principal | ICD-10-CM

## 2020-01-23 DIAGNOSIS — C482 Malignant neoplasm of peritoneum, unspecified: Principal | ICD-10-CM

## 2020-01-30 DIAGNOSIS — C482 Malignant neoplasm of peritoneum, unspecified: Principal | ICD-10-CM

## 2020-02-13 DIAGNOSIS — C482 Malignant neoplasm of peritoneum, unspecified: Principal | ICD-10-CM

## 2020-02-27 DIAGNOSIS — C482 Malignant neoplasm of peritoneum, unspecified: Principal | ICD-10-CM

## 2020-03-12 DIAGNOSIS — C482 Malignant neoplasm of peritoneum, unspecified: Principal | ICD-10-CM

## 2020-04-10 DEATH — deceased
